# Patient Record
Sex: Female | Born: 1945 | Race: White | Hispanic: No | Marital: Married | Smoking: Never smoker
Health system: Southern US, Community
[De-identification: ages and names within clinical notes are randomized; demographics above are authoritative.]

---

## 2023-06-16 DIAGNOSIS — E872 Acidosis, unspecified: Secondary | ICD-10-CM

## 2023-06-16 DIAGNOSIS — R410 Disorientation, unspecified: Principal | ICD-10-CM | POA: Diagnosis present

## 2023-06-16 DIAGNOSIS — G9341 Metabolic encephalopathy: Secondary | ICD-10-CM | POA: Diagnosis present

## 2023-06-16 DIAGNOSIS — W19XXXA Unspecified fall, initial encounter: Secondary | ICD-10-CM

## 2023-06-16 DIAGNOSIS — G9340 Encephalopathy, unspecified: Principal | ICD-10-CM

## 2023-06-16 NOTE — ED Provider Notes (Signed)
EMERGENCY DEPARTMENT AT [facility] Provider Note   CSN: [id number] Arrival date & time: 06/16/23  2241     History {Add pertinent medical, surgical, social history, OB history to HPI:1} No chief complaint on file.   Lorraine James is a 77 y.o. female.  HPI    77 year old patient comes in with chief complaint of mechanical fall. Patient comes from her home.  I called patient's home phone, but there was no response.  Patient arrives as a level 1 trauma.  Patient brought here by EMS.  According to EMS, patient had unwitnessed fall and was found on the floor by family.  Patient was placed on nonrebreather by them and she required intermittent BVM.  Patient also on Xarelto.  Additionally patient was found to have single blood pressure that was in the 80s systolic.  Unknown last known normal.  Home Medications Prior to Admission medications   Not on File      Allergies    Patient has no allergy information on record.    Review of Systems   Review of Systems  All other systems reviewed and are negative.   Physical Exam Updated Vital Signs BP 108/60   Temp 98.8 F (37.1 C)  Physical Exam Vitals and nursing note reviewed.  Constitutional:      Appearance: She is well-developed.     Comments: Somnolent, responds to noxious stimuli  HENT:     Head: Atraumatic.  Neck:     Comments: In a c-collar Cardiovascular:     Rate and Rhythm: Normal rate.  Pulmonary:     Effort: Pulmonary effort is normal.  Abdominal:     Tenderness: There is no abdominal tenderness.  Musculoskeletal:     Comments: Head to toe evaluation shows no hematoma, bleeding of the scalp, no facial abrasions, no spine step offs, crepitus of the chest or neck, no tenderness to palpation of the bilateral upper and lower extremities, no gross deformities, no chest tenderness, no pelvic pain.   Skin:    General: Skin is warm and dry.  Neurological:     Mental Status: She is oriented to  person, place, and time.     ED Results / Procedures / Treatments   Labs (all labs ordered are listed, but only abnormal results are displayed) Labs Reviewed  CBC - Abnormal; Notable for the following components:      Result Value   RBC 2.32 (*)    Hemoglobin 7.3 (*)    HCT 23.5 (*)    MCV 101.3 (*)    RDW 17.2 (*)    All other components within normal limits  I-STAT CHEM 8, ED - Abnormal; Notable for the following components:   Creatinine, Ser 1.30 (*)    Glucose, Bld 134 (*)    Hemoglobin 7.8 (*)    HCT 23.0 (*)    All other components within normal limits  I-STAT VENOUS BLOOD GAS, ED - Abnormal; Notable for the following components:   HCT 21.0 (*)    Hemoglobin 7.1 (*)    All other components within normal limits  PROTIME-INR  COMPREHENSIVE METABOLIC PANEL  ETHANOL  URINALYSIS, ROUTINE W REFLEX MICROSCOPIC  I-STAT CHEM 8, ED  I-STAT CG4 LACTIC ACID, ED  SAMPLE TO BLOOD BANK    EKG None   Date: 06/16/2023  Rate: 82  Rhythm: normal sinus rhythm  QRS Axis: normal  Intervals: normal  ST/T Wave abnormalities: normal  Conduction Disutrbances: none  Narrative Interpretation: unremarkable  Radiology No results found.  Procedures .Critical Care  Performed by: Derwood Kaplan, MD Authorized by: Derwood Kaplan, MD   Critical care provider statement:    Critical care time (minutes):  58   Critical care was necessary to treat or prevent imminent or life-threatening deterioration of the following conditions:  CNS failure or compromise   Critical care was time spent personally by me on the following activities:  Development of treatment plan with patient or surrogate, discussions with consultants, evaluation of patient's response to treatment, examination of patient, ordering and review of laboratory studies, ordering and review of radiographic studies, ordering and performing treatments and interventions, pulse oximetry, re-evaluation of patient's condition and  review of old charts   {Document cardiac monitor, telemetry assessment procedure when appropriate:1}  Medications Ordered in ED Medications  iohexol (OMNIPAQUE) 350 MG/ML injection 75 mL (75 mLs Intravenous Contrast Given 06/16/23 2308)    ED Course/ Medical Decision Making/ A&P   {   Click here for ABCD2, HEART and other calculatorsREFRESH Note before signing :1}                              Medical Decision Making Amount and/or Complexity of Data Reviewed Labs: ordered. Radiology: ordered.  Risk Prescription drug management.   77 year old patient with pertinent past medical history of Plavix use for CAD comes in with chief complaint of acute altered mental status change.  Collateral history provided by EMS.  Unable to reach family when I call the home phone.  I have reviewed patient's previous records including the medications.  Patient is not on any pain meds.  She does take gabapentin.  Differential diagnosis considered for this patient includes: ICH / Stroke, acute coronary syndrome, Infection - UTI/Pneumonia/soft tissue infection leading to encephalopathy, encephalopathy due to electrolyte abnormality or drug interactions or toxins or metabolic conditions like adrenal insufficiency, hyperglycemia, paraneoplastic process, Hypercapnia / COPD Hypoxia  Based on initial assessment, higher suspicion for acute brain bleed.  Polypharmacy.  UTI.  Plan is to get basic labs and CT scan of the brain, CT blunt chest abdomen pelvis with contrast.  Reassessment: I have independently reviewed the following labs: Patient found to have anemia with hemoglobin less than 8.  She also has 7.28 is pH without hypercapnia.  Ethanol level normal.  I have independently reviewed the following imaging: CT scan of the brain, which is negative for acute brain bleed.  Patient was also reassessed at 12:20 AM.  Patient continues to have waxing and waning mentation.  She responds to noxious stimuli,  follows simple commands, but does not communicate and then she falls asleep again.  CT angiogram ordered to see if there is any basilar insufficiency. Given patient's anemia, she will need admission to the hospital.  She is Hemoccult negative.   Final Clinical Impression(s) / ED Diagnoses Final diagnoses:  None    Rx / DC Orders ED Discharge Orders     None

## 2023-06-16 NOTE — ED Provider Notes (Incomplete)
   EMERGENCY DEPARTMENT AT [facility] Provider Note   CSN: [id number] Arrival date & time: 06/16/23  2241     History {Add pertinent medical, surgical, social history, OB history to HPI:1} No chief complaint on file.   Lorraine James is a 77 y.o. female.  HPI    77 year old patient comes in with chief complaint of mechanical fall.   Home Medications Prior to Admission medications   Not on File      Allergies    Patient has no allergy information on record.    Review of Systems   Review of Systems  All other systems reviewed and are negative.   Physical Exam Updated Vital Signs BP 108/60   Temp 98.8 F (37.1 C)  Physical Exam Vitals and nursing note reviewed.  Constitutional:      Appearance: She is well-developed.  HENT:     Head: Atraumatic.  Cardiovascular:     Rate and Rhythm: Normal rate.  Pulmonary:     Effort: Pulmonary effort is normal.  Musculoskeletal:     Cervical back: Normal range of motion and neck supple.  Skin:    General: Skin is warm and dry.  Neurological:     Mental Status: She is alert and oriented to person, place, and time.     ED Results / Procedures / Treatments   Labs (all labs ordered are listed, but only abnormal results are displayed) Labs Reviewed  CBC - Abnormal; Notable for the following components:      Result Value   RBC 2.32 (*)    Hemoglobin 7.3 (*)    HCT 23.5 (*)    MCV 101.3 (*)    RDW 17.2 (*)    All other components within normal limits  I-STAT CHEM 8, ED - Abnormal; Notable for the following components:   Creatinine, Ser 1.30 (*)    Glucose, Bld 134 (*)    Hemoglobin 7.8 (*)    HCT 23.0 (*)    All other components within normal limits  I-STAT VENOUS BLOOD GAS, ED - Abnormal; Notable for the following components:   HCT 21.0 (*)    Hemoglobin 7.1 (*)    All other components within normal limits  PROTIME-INR  COMPREHENSIVE METABOLIC PANEL  ETHANOL  URINALYSIS, ROUTINE W REFLEX  MICROSCOPIC  I-STAT CHEM 8, ED  I-STAT CG4 LACTIC ACID, ED  SAMPLE TO BLOOD BANK    EKG None   Date: 06/16/2023  Rate: 82  Rhythm: normal sinus rhythm  QRS Axis: normal  Intervals: normal  ST/T Wave abnormalities: normal  Conduction Disutrbances: none  Narrative Interpretation: unremarkable   Radiology No results found.  Procedures Procedures  {Document cardiac monitor, telemetry assessment procedure when appropriate:1}  Medications Ordered in ED Medications  iohexol (OMNIPAQUE) 350 MG/ML injection 75 mL (75 mLs Intravenous Contrast Given 06/16/23 2308)    ED Course/ Medical Decision Making/ A&P   {   Click here for ABCD2, HEART and other calculatorsREFRESH Note before signing :1}                              Medical Decision Making Amount and/or Complexity of Data Reviewed Labs: ordered. Radiology: ordered.  Risk Prescription drug management.    Final Clinical Impression(s) / ED Diagnoses Final diagnoses:  None    Rx / DC Orders ED Discharge Orders     None

## 2023-06-16 NOTE — ED Triage Notes (Signed)
Pt BIB [facility] from home. Pt had unwitnessed  fall and found in the floor by family; on arrival pt had nonrebreather on.  Pt takes Xeralto.  Per EMS pt has had 6 other falls this month requiring transport.

## 2023-06-16 NOTE — ED Notes (Signed)
Trauma Response Nurse Documentation   Lorraine James is a 77 y.o. female arriving to [facility] ED via [facility]  On {meds; anticoagulants:31417}. Trauma was activated as a {Trauma Level:26868} by *** based on the following trauma criteria {Trauma criteria:26865}.  Patient cleared for CT by Dr. Marland Kitchen Pt transported to {TRN Radiology:26861::"CT"} with trauma response nurse present to monitor. RN remained with the patient throughout their absence from the department for clinical observation.   [facility] ***.  History   No past medical history on file.   *** The histories are not reviewed yet. Please review them in the "History" navigator section and refresh this SmartLink.     Initial Focused Assessment (If applicable, or please see trauma documentation):   CT's Completed:   {Trauma CT:26866}   Interventions:   Plan for disposition:  {Trauma Dispo:26867}   Consults completed:  {Trauma Consults:26862} at ***.  Event Summary:  MTP Summary (If applicable):   Bedside handoff with {Trauma handoff:26863::"ED RN"} ***.    Leota Sauers  Trauma Response RN  Please call TRN at [phone] for further assistance.

## 2023-06-16 NOTE — Progress Notes (Signed)
Orthopedic Tech Progress Note Patient Details:  Lorraine James 1946/03/08 [id number]  Patient ID: [name], female   DOB: Jan 25, 1946, 77 y.o.   MRN: [id number] I attended trauma page. Trinna Post 06/16/2023, 11:18 PM

## 2023-06-17 ENCOUNTER — Emergency Department (HOSPITAL_COMMUNITY): Payer: Medicare PPO

## 2023-06-17 ENCOUNTER — Inpatient Hospital Stay (HOSPITAL_COMMUNITY): Payer: Medicare PPO

## 2023-06-17 DIAGNOSIS — W1830XA Fall on same level, unspecified, initial encounter: Secondary | ICD-10-CM

## 2023-06-17 DIAGNOSIS — S42202A Unspecified fracture of upper end of left humerus, initial encounter for closed fracture: Secondary | ICD-10-CM

## 2023-06-17 DIAGNOSIS — R410 Disorientation, unspecified: Principal | ICD-10-CM | POA: Diagnosis present

## 2023-06-17 DIAGNOSIS — R569 Unspecified convulsions: Secondary | ICD-10-CM | POA: Diagnosis not present

## 2023-06-17 DIAGNOSIS — G9341 Metabolic encephalopathy: Secondary | ICD-10-CM | POA: Diagnosis present

## 2023-06-17 NOTE — ED Notes (Signed)
Patient transported to MRI 

## 2023-06-17 NOTE — ED Notes (Signed)
Lorraine James (son) [phone]. Would like an update

## 2023-06-17 NOTE — ED Notes (Signed)
PT's daughter informed this RN that brother is regulating PT's medications at home and has been unsuccessful, stating that PT's Rx are empty before their refill dates, and PT has stopped allowing her son to hold onto medicine for her. She is worried that overuse of prescriptions is the cause of increased confusion and falls recently, and is requesting assistance with developing a plan of care for her mother (the PT)

## 2023-06-17 NOTE — Progress Notes (Signed)
Pharmacy Antibiotic Note  Lorraine James is a 77 y.o. female admitted on 06/16/2023 with  ?UTI .  Pharmacy has been consulted for Zosyn dosing. WBC WNL. Scr 1.3.   Plan: Zosyn 3.375G IV q8h to be infused over 4 hours F/U cultures   Temp (24hrs), Avg:98.4 F (36.9 C), Min:97.9 F (36.6 C), Max:98.8 F (37.1 C)  Recent Labs  Lab 06/16/23 2048 06/16/23 2248 06/16/23 2249  WBC 8.0  --   --   CREATININE 1.44* 1.30*  --   LATICACIDVEN  --   --  0.6    CrCl cannot be calculated (Unknown ideal weight.).    Not on File  Abran Duke, PharmD, BCPS Clinical Pharmacist Phone: [phone]

## 2023-06-17 NOTE — Procedures (Signed)
Patient Name: Lorraine James  MRN: [id number]  Epilepsy Attending: Charlsie Quest  Referring Physician/Provider: Gilda Crease, MD  Date: 06/17/2023 Duration: 24.44 mins  Patient history: 77 yo F with ams getting eeg to evaluate for seizure  Level of alertness: Awake  AEDs during EEG study: None  Technical aspects: This EEG study was done with scalp electrodes positioned according to the 10-20 International system of electrode placement. Electrical activity was reviewed with band pass filter of 1-70Hz , sensitivity of 7 uV/mm, display speed of 26mm/sec with a 60Hz  notched filter applied as appropriate. EEG data were recorded continuously and digitally stored.  Video monitoring was available and reviewed as appropriate.  Description: EEG showed continuous generalized polymorphic 3 to 6 Hz theta-delta slowing. Physiologic photic driving was not seen during photic stimulation.  Hyperventilation was not performed.     ABNORMALITY - Continuous slow, generalized  IMPRESSION: This study is suggestive of moderate diffuse encephalopathy. No seizures or epileptiform discharges were seen throughout the recording.  Talicia Sui Annabelle Harman

## 2023-06-17 NOTE — ED Provider Notes (Addendum)
Patient signed out to me by Dr. Rhunette Croft.  Patient seen as a level 1 trauma.  Patient reportedly fell at home and is on blood thinners, was altered.  Trauma workup has been unremarkable other than proximal humerus fracture on the left.  Dr. Hillery Hunter, trauma, does not feel that there is any further trauma workup necessary.  Patient still somnolent.  We did not have a lot of records on the patient initially.  I have contacted her son, Laurine Blazer at [phone] and he has provided additional information.  Patient does have a history of waxing and waning levels of consciousness and has had admissions with significant confusion especially with dehydration and UTI.  Patient's current medication list is gabapentin, telmisartan, atorvastatin, oxycodone, omeprazole, fenofibrate, multivitamin and she also has baclofen as needed.  She does have multiple medications that could be causing sedation.  Discussed briefly with neurology.  Will obtain routine MRI, routine EEG.  In and out urinalysis pending.  Admit to medicine.  Addendum: UA positive, rocephin given, culture pending.   Discussed with Dr. Roda Shutters, orthopedics, will consult.   Gilda Crease, MD 06/17/23 7829    Gilda Crease, MD 06/17/23 5621    Gilda Crease, MD 06/17/23 0630

## 2023-06-17 NOTE — Final Consult Note (Signed)
Patient ID: Lorraine James 06/24/1946  Admit date: 06/16/2023 Admitting Diagnoses: Unwitnessed fall L humerus fracture Possible UTI  Tertiary Survey: Airway intact and oxygenating well. Hemodynamically stable. Confused but pleasant and able to move extremities. Injury to LUE.   Trauma scans reviewed: Yes Additional Imaging recommendations: No Labs reviewed: Yes Additional lab work recommendations: Yes: trend hgb and transfuse for hgb <7.0  Does the patient have any new complaints? No Cervical Collar Cleared: Yes DVT ppx ordered? No and recommend holding xarelto until hgb stabilized Note: dosing for DVT prophylaxis in the setting of trauma and normal renal function is 30mg  BID or 40mg  BID if >100kg or BMI>35 Is patient age > 15 (77 y.o.) : Yes  Exam: General: pleasant, WD, elderly female who is laying in bed in NAD HEENT: head is normocephalic, atraumatic.  Sclera are noninjected.  PERRL.  Ears and nose without any masses or lesions.  Mouth is pink and moist Heart: regular, rate, and rhythm. Palpable radial and pedal pulses bilaterally Lungs: CTAB, no wheezes, rhonchi, or rales noted.  Respiratory effort nonlabored Abd: soft, NT, ND MS: 2+ pitting edema to BLE, mild ttp of LUE, L hand WWP Neuro: following commands Psych: pleasantly confused.  Incidental Findings: Possible UTI - Cx pending, abx per primary   Wound Care: none   Follow-up Appointments: Orthopedic surgery   Trauma will sign off at this time, remainder of care per [facility] and orthopedic surgery.   Juliet Rude, [facility] 06/17/2023, 8:55 AM Please see Amion for pager number during day hours 7:00am-4:30pm

## 2023-06-17 NOTE — ED Notes (Signed)
Pt is awake and talking to self at this time. This RN asked the pt orientation questions; pt continues to repeat the same things in reference to "her working at [employer]". Pt is unable to tell this RN where she is. Pt does admit to taking Triazolam last night.

## 2023-06-17 NOTE — Evaluation (Signed)
Clinical/Bedside Swallow Evaluation Patient Details  Name: Lorraine James MRN: [id number] Date of Birth: August 10, 1945  Today's Date: 06/17/2023 Time: SLP Start Time (ACUTE ONLY): 1315 SLP Stop Time (ACUTE ONLY): 1335 SLP Time Calculation (min) (ACUTE ONLY): 20 min  Past Medical History: No past medical history on file.  HPI:  Patient is a 77 y.o. female with PMH: HTN, intermittent confusion, chronic pain. She presented to the hospital on 06/17/23 as a level 1 trauma after falling at home. (is on blood thinners) She lives with her son and per his report, she started getting confused day prior to admission and day of admission he heard a big thud and saw her down in the kitchen. She was discharged from [facility] a week ago for confusion and at that time, only explanation medically was dehydration. For current admission,, UA was postive for infection, she has had persistent encephalopathy and trauma workup revealed a left humeral fracture. MRI brain negative for acute intracranial abnormality but did show extensive chronic small vessel ischemia. She was made NPO and swallow evaluation ordered.    Assessment / Plan / Recommendation  Clinical Impression  Patient is not currently presenting with clinical s/s of dyspahgia as per this bedside swallow evaluation but she is significantly confused and this can put her at risk for PO intolerance. SLP provided setup assistance and patient then able to feed self water (via straw sips), puree (applesauce) and mechanical soft (graham crackers). She is edentulous but when she started eating graham crackers she exclaimed, "where are my dentures?" SLP was able to locat them in denture cup with her personal belongings and rinsed and cleaned them off. She was able to put dentures in and reported they fit fine but that "these aren't mine", asking SLP to show her her name on denture cup (patient label sticker). No overt s/s aspiration observed. SLP recomending  initiate PO diet of dysphagia 3 (mechanical soft) solids, thin liquids and will follow up at least once to ensure toleration and advance to regular textures. SLP Visit Diagnosis: Dysphagia, unspecified (R13.10)    Aspiration Risk  No limitations    Diet Recommendation Dysphagia 3 (Mech soft);Thin liquid    Liquid Administration via: Cup;Straw Medication Administration: Whole meds with liquid Supervision: Patient able to self feed;Intermittent supervision to cue for compensatory strategies Compensations: Slow rate;Small sips/bites Postural Changes: Seated upright at 90 degrees    Other  Recommendations Oral Care Recommendations: Oral care BID    Recommendations for follow up therapy are one component of a multi-disciplinary discharge planning process, led by the attending physician.  Recommendations may be updated based on patient status, additional functional criteria and insurance authorization.  Follow up Recommendations No SLP follow up      Assistance Recommended at Discharge    Functional Status Assessment Patient has had a recent decline in their functional status and demonstrates the ability to make significant improvements in function in a reasonable and predictable amount of time.  Frequency and Duration min 1 x/week  1 week       Prognosis Prognosis for improved oropharyngeal function: Good      Swallow Study   General Date of Onset: 06/17/23 HPI: Patient is a 77 y.o. female with PMH: HTN, intermittent confusion, chronic pain. She presented to the hospital on 06/17/23 as a level 1 trauma after falling at home. (is on blood thinners) She lives with her son and per his report, she started getting confused day prior to admission and day of admission he  heard a big thud and saw her down in the kitchen. She was discharged from [facility] a week ago for confusion and at that time, only explanation medically was dehydration. For current admission,, UA was postive for  infection, she has had persistent encephalopathy and trauma workup revealed a left humeral fracture. MRI brain negative for acute intracranial abnormality but did show extensive chronic small vessel ischemia. She was made NPO and swallow evaluation ordered. Type of Study: Bedside Swallow Evaluation Previous Swallow Assessment: none found Diet Prior to this Study: NPO Temperature Spikes Noted: No Respiratory Status: Room air History of Recent Intubation: No Behavior/Cognition: Alert;Cooperative;Pleasant mood;Confused Oral Cavity Assessment: Within Functional Limits Oral Care Completed by SLP: No Oral Cavity - Dentition: Dentures, top;Dentures, bottom Vision: Functional for self-feeding Self-Feeding Abilities: Able to feed self Patient Positioning: Upright in bed Baseline Vocal Quality: Normal Volitional Cough: Cognitively unable to elicit Volitional Swallow: Unable to elicit    Oral/Motor/Sensory Function Overall Oral Motor/Sensory Function: Within functional limits   Ice Chips     Thin Liquid Thin Liquid: Within functional limits Presentation: Straw;Self Fed    Nectar Thick     Honey Thick     Puree Puree: Within functional limits Presentation: Spoon;Self Fed   Solid     Solid: Within functional limits Presentation: Self Fed      Angela Nevin, MA, CCC-SLP Speech Therapy

## 2023-06-17 NOTE — ED Notes (Signed)
Pts dentures removed and placed in denture cup with pt label.

## 2023-06-17 NOTE — Progress Notes (Signed)
Contacted nurse in regards to comfort care to see if EEG was still wanted. Currently waiting for provider to decide on if EEG is needed.

## 2023-06-17 NOTE — Progress Notes (Signed)
   06/16/23 2235  Spiritual Encounters  Type of Visit Initial  Care provided to: Pt not available  Referral source Trauma page  Reason for visit Trauma  OnCall Visit No   Chaplain responded to a level one trauma. The patient, Lorraine James, was attended to by the medical team. No family is present. If a chaplain is requested someone will respond.   Valerie Roys [facility]  [phone]

## 2023-06-17 NOTE — Progress Notes (Signed)
EEG complete - results pending 

## 2023-06-17 NOTE — Evaluation (Signed)
Physical Therapy Evaluation Patient Details Name: Lorraine James MRN: [id number] DOB: 04/18/1946 Today's Date: 06/17/2023  History of Present Illness  This is a 77 year old woman who presented as a level 1 trauma after falling at home on blood thinners.  Full trauma workup revealed a left humeral fracture.  She has had persistent encephalopathy since admission.  PMH of HTN, chronic pain, VTE.  Clinical Impression  Pt presents with admitting diagnosis above. Pt very confused stating that we are at [employer] and she is looking for "cheese bracelets". Pt constantly pulling at lines and leads requiring constant cues for safety. Pt today required +2 Max/Total A for mobility. PT able to stand EOB however noted with severe knee buckling when taking steps. Pt was a poor historian however reports that she occasionally used a walker at home however no family was present to confirm this. Patient will benefit from continued inpatient follow up therapy, <3 hours/day. PT will continue to follow.         If plan is discharge home, recommend the following: Two people to help with walking and/or transfers;A lot of help with bathing/dressing/bathroom;Assistance with cooking/housework;Direct supervision/assist for medications management;Assist for transportation;Help with stairs or ramp for entrance;Direct supervision/assist for financial management;Supervision due to cognitive status   Can travel by private vehicle   No    Equipment Recommendations Other (comment) (Per accepting facility)  Recommendations for Other Services       Functional Status Assessment Patient has had a recent decline in their functional status and demonstrates the ability to make significant improvements in function in a reasonable and predictable amount of time.     Precautions / Restrictions Precautions Precautions: Fall Required Braces or Orthoses: Sling Restrictions Weight Bearing Restrictions: Yes LUE Weight Bearing: Non  weight bearing      Mobility  Bed Mobility Overal bed mobility: Needs Assistance Bed Mobility: Supine to Sit, Sit to Supine     Supine to sit: +2 for physical assistance, Max assist Sit to supine: +2 for safety/equipment, Total assist   General bed mobility comments: Via helicopter method    Transfers Overall transfer level: Needs assistance Equipment used: 2 person hand held assist Transfers: Sit to/from Stand Sit to Stand: +2 physical assistance, Mod assist, From elevated surface           General transfer comment: +2 Mod A from elevated stretcher. Pt noted with knee buckling with side steps and standing marches.    Ambulation/Gait               General Gait Details: deferred for safety  Stairs            Wheelchair Mobility     Tilt Bed    Modified Rankin (Stroke Patients Only)       Balance Overall balance assessment: Needs assistance Sitting-balance support: Single extremity supported, Feet supported Sitting balance-Leahy Scale: Poor Sitting balance - Comments: Attempting to lay back down Postural control: Left lateral lean Standing balance support: Bilateral upper extremity supported Standing balance-Leahy Scale: Poor Standing balance comment: Reliant on external support                             Pertinent Vitals/Pain Pain Assessment Pain Assessment: Faces Faces Pain Scale: Hurts even more Pain Location: L arm Pain Descriptors / Indicators: Discomfort, Grimacing, Moaning Pain Intervention(s): Monitored during session, Repositioned    Home Living Family/patient expects to be discharged to:: Private residence Living Arrangements: Children (Son)  Available Help at Discharge: Family;Available PRN/intermittently Type of Home: House Home Access: Level entry           Additional Comments: Pt not a great historian however reports that she lives with her son who helps out. Says she uses a walker occasionally.    Prior  Function Prior Level of Function : Patient poor historian/Family not available             Mobility Comments: Pt reports using a RW sometimes.       Extremity/Trunk Assessment   Upper Extremity Assessment Upper Extremity Assessment: LUE deficits/detail LUE Deficits / Details: L humerus fx    Lower Extremity Assessment Lower Extremity Assessment: Generalized weakness    Cervical / Trunk Assessment Cervical / Trunk Assessment: Kyphotic  Communication   Communication Communication: Difficulty following commands/understanding Following commands: Follows one step commands inconsistently;Follows multi-step commands inconsistently Cueing Techniques: Verbal cues;Tactile cues  Cognition Arousal: Alert Behavior During Therapy: Impulsive, Restless Overall Cognitive Status: Impaired/Different from baseline Area of Impairment: Orientation, Attention, Memory, Safety/judgement, Following commands, Awareness, Problem solving                 Orientation Level: Disoriented to, Place, Time, Situation Current Attention Level: Alternating Memory: Decreased short-term memory, Decreased recall of precautions Following Commands: Follows one step commands inconsistently, Follows multi-step commands inconsistently Safety/Judgement: Decreased awareness of safety, Decreased awareness of deficits Awareness: Emergent Problem Solving: Slow processing, Decreased initiation, Difficulty sequencing, Requires verbal cues, Requires tactile cues General Comments: Pt very confused stating that we are at [employer] and she is looking for "cheese bracelets". Pt constantly pulling at lines and leads required constant cues for safety.        General Comments General comments (skin integrity, edema, etc.): VSS    Exercises     Assessment/Plan    PT Assessment Patient needs continued PT services  PT Problem List Decreased strength;Decreased range of motion;Decreased activity tolerance;Decreased  balance;Decreased mobility;Decreased coordination;Decreased cognition;Decreased knowledge of use of DME;Decreased safety awareness;Decreased knowledge of precautions;Pain       PT Treatment Interventions DME instruction;Gait training;Stair training;Functional mobility training;Therapeutic activities;Therapeutic exercise;Balance training;Neuromuscular re-education;Cognitive remediation;Patient/family education    PT Goals (Current goals can be found in the Care Plan section)  Acute Rehab PT Goals PT Goal Formulation: Patient unable to participate in goal setting Time For Goal Achievement: 07/01/23 Potential to Achieve Goals: Poor    Frequency Min 1X/week     Co-evaluation               AM-PAC PT "6 Clicks" Mobility  Outcome Measure Help needed turning from your back to your side while in a flat bed without using bedrails?: A Lot Help needed moving from lying on your back to sitting on the side of a flat bed without using bedrails?: A Lot Help needed moving to and from a bed to a chair (including a wheelchair)?: Total Help needed standing up from a chair using your arms (e.g., wheelchair or bedside chair)?: Total Help needed to walk in hospital room?: Total Help needed climbing 3-5 steps with a railing? : Total 6 Click Score: 8    End of Session Equipment Utilized During Treatment: Gait belt Activity Tolerance: Patient limited by pain;Patient limited by fatigue Patient left: in bed;with call bell/phone within reach;with nursing/sitter in room Nurse Communication: Mobility status PT Visit Diagnosis: Other abnormalities of gait and mobility (R26.89)    Time: 1610-9604 PT Time Calculation (min) (ACUTE ONLY): 17 min   Charges:   PT Evaluation $  PT Eval Moderate Complexity: 1 Mod   PT General Charges $$ ACUTE PT VISIT: 1 Visit         Shela Nevin, PT, DPT Acute Rehab Services [phone]   Gladys Damme 06/17/2023, 10:17 AM

## 2023-06-17 NOTE — Progress Notes (Signed)
Son updated on pt status

## 2023-06-17 NOTE — H&P (Addendum)
NAME:  Lorraine James, MRN:  [id number], DOB:  May 16, 1946, LOS: 0 ADMISSION DATE:  06/16/2023, CONSULTATION DATE:  06/17/23 REFERRING MD:  Blinda Leatherwood, CHIEF COMPLAINT:  ams   History of Present Illness:  This is a 77 year old woman who presented as a level 1 trauma after falling at home on blood thinners.  Full trauma workup revealed a left humeral fracture.  She has had persistent encephalopathy since admission.  UA positive for infection.  Due to a question of ability to protect airway, PCCM consulted for admission.  On arrival, the patient will wake up to painful stimuli and move all 4 of her extremities.  She keeps asking "[facility] am [facility] [employer], imaging, clinical course to date reviewed.  Per son, yesterday started getting confused.  Son heard a big thud and saw her down in the kitchen.  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  Could not find reason for this other than dehydration  Patient's current medication list is gabapentin, telmisartan, atorvastatin, oxycodone, omeprazole, fenofibrate, multivitamin and she also has baclofen as needed   Significant Hospital Events: Including procedures, antibiotic start and stop dates in addition to other pertinent events   11/19 admit  Interim History / Subjective:  Admit  Objective   Blood pressure (!) 142/71, pulse 97, temperature 97.9 F (36.6 C), temperature source Axillary, resp. rate (!) 24, SpO2 95%.       No intake or output data in the 24 hours ending 06/17/23 0538 There were no vitals filed for this visit.  Examination: General: No acute distress HENT: Dilated but reactive pupils, tracking Lungs: Clear, no accessory muscle use Cardiovascular: Heart sounds are regular, extremities are warm Abdomen: Soft, positive bowel sounds Extremities: No edema, mild arthritic changes Neuro: Unable to move  left upper arm.  No obvious asterixis.  Alert and oriented potentially to self.  RASS -1 Skin: no rashes  MRI negative EEG pending Pan scan with a very small subpleural nodule in the right lower lobe, left humeral neck fracture  Resolved Hospital Problem list   Not applicable  Assessment & Plan:  Acute metabolic encephalopathy in the setting of UTI and multiple CNS altering home medications Status post fall with left humeral fracture, no evidence of intracranial injury Chronic pain, hypertension Mild hypercapnia-doubt she would tolerate BiPAP but it has been ordered  -Follow-up EEG, ammonia -Avoid sedating meds -Will do Zosyn as she had a recent hospitalization, follow-up culture data from UTI (avoiding cefepime in the context of encephalopathy) -PT/OT/SLP consults -Will need a sling for her arm and outpatient orthopedic follow-up -She does not really have any ICU needs, I will sign [facility] for [facility] 06/18/2023 -Spoke with son on phone, patient is DNR  Best Practice (right click and "Reselect all SmartList Selections" daily)   Diet/type: NPO DVT prophylaxis: SCD Pressure ulcer(s).  GI prophylaxis: N/A Lines: N/A Foley:  N/A Code Status:  DNR Last date of multidisciplinary goals of care discussion [updated son 06/17/23]  Labs   CBC: Recent Labs  Lab 06/16/23 2048 06/16/23 2248 06/16/23 2316  WBC 8.0  --   --   HGB 7.3* 7.8* 7.1*  HCT 23.5* 23.0* 21.0*  MCV 101.3*  --   --   PLT 189  --   --     Basic Metabolic Panel: Recent Labs  Lab 06/16/23 2048 06/16/23  2248 06/16/23 2316  NA 141 144 143  K 3.8 3.8 3.7  CL 106 103  --   CO2 27  --   --   GLUCOSE 136* 134*  --   BUN 21 23  --   CREATININE 1.44* 1.30*  --   CALCIUM 8.4*  --   --    GFR: CrCl cannot be calculated (Unknown ideal weight.). Recent Labs  Lab 06/16/23 2048 06/16/23 2249  WBC 8.0  --   LATICACIDVEN  --  0.6    Liver Function Tests: Recent Labs  Lab 06/16/23 2048  AST 39   ALT 19  ALKPHOS 33*  BILITOT 0.9  PROT 5.6*  ALBUMIN 2.9*   No results for input(s): "LIPASE", "AMYLASE" in the last 168 hours. Recent Labs  Lab 06/17/23 0346  AMMONIA 13    ABG    Component Value Date/Time   HCO3 28.0 06/16/2023 2316   TCO2 30 06/16/2023 2316   O2SAT 68 06/16/2023 2316     Coagulation Profile: Recent Labs  Lab 06/16/23 2048  INR 1.1    Cardiac Enzymes: Recent Labs  Lab 06/17/23 0145  CKTOTAL 208    HbA1C: No results found for: "HGBA1C"  CBG: No results for input(s): "GLUCAP" in the last 168 hours.  Review of Systems:   Too encephalopathic to answer  Past Medical History:  HTN, chronic pain  Surgical History:  Unk  Social History:      Family History:  Her family history is not on file.   Allergies Not on File   Home Medications  Prior to Admission medications   Not on File   Patient's current medication list is gabapentin, telmisartan, atorvastatin, oxycodone, omeprazole, fenofibrate, multivitamin and she also has baclofen as needed   Critical care time: N/A

## 2023-06-18 DIAGNOSIS — R410 Disorientation, unspecified: Secondary | ICD-10-CM | POA: Diagnosis not present

## 2023-06-18 DIAGNOSIS — S42212A Unspecified displaced fracture of surgical neck of left humerus, initial encounter for closed fracture: Secondary | ICD-10-CM

## 2023-06-18 MED ORDER — OXYCODONE HCL 5 MG PO TABS
5.0000 mg | ORAL_TABLET | ORAL | Status: AC
Start: 2023-06-18 — End: 2023-06-18

## 2023-06-18 NOTE — Progress Notes (Signed)
   06/18/23 2036  BiPAP/CPAP/SIPAP  BiPAP/CPAP/SIPAP Pt Type Adult  Reason BIPAP/CPAP not in use Non-compliant

## 2023-06-18 NOTE — Progress Notes (Signed)
Order for Lidocaine patch, but she is asleep so will wait for her to wake up to place patch on left arm.

## 2023-06-18 NOTE — Progress Notes (Addendum)
Pain continues and is now 8/10 left arm. Lidocaine patch that was ordered is not helping. Notified Dr C. Hall . No other orders yet for anything for pain control.  0500- New order for pain control . See EMAR.

## 2023-06-18 NOTE — TOC Initial Note (Signed)
Transition of Care [facility]) - Initial/Assessment Note    Patient Details  Name: Lorraine James MRN: [id number] Date of Birth: 08-Jun-1946  [facility]) CM/SW Contact:    [place] Marchiano Felipa Emory, Student-Social Work Phone Number: 06/18/2023, 1:48 PM  Clinical Narrative:   MSW Student met with patient family at bedside and discussed possible SNF placements. Family were agreeable to recommendation. MSW Student discussed CMS choice with family and received permission to send out referral, will follow up with bed offers.   Expected Discharge Plan: Skilled Nursing Facility Barriers to Discharge: Insurance Authorization, Continued Medical Work up   Patient Goals and CMS Choice Patient states their goals for this hospitalization and ongoing recovery are:: patient unable to participate in goal setting, not fully oriented CMS Medicare.gov Compare Post Acute Care list provided to:: Patient Represenative (must comment) Choice offered to / list presented to : Adult Children [place] ownership interest in [facility].provided to:: Adult Children    Expected Discharge Plan and Services     Post Acute Care Choice: Skilled Nursing Facility Living arrangements for the past 2 months: Single Family Home                                      Prior Living Arrangements/Services Living arrangements for the past 2 months: Single Family Home Lives with:: Adult Children Patient language and need for interpreter reviewed:: No Do you feel safe going back to the place where you live?: Yes      Need for Family Participation in Patient Care: Yes (Comment) Care giver support system in place?: Yes (comment)   Criminal Activity/Legal Involvement Pertinent to Current Situation/Hospitalization: No - Comment as needed  Activities of Daily Living   ADL Screening (condition at time of admission) Independently performs ADLs?: No Does the patient have a NEW difficulty with  bathing/dressing/toileting/self-feeding that is expected to last >3 days?: Yes (Initiates electronic notice to provider for possible OT consult) Does the patient have a NEW difficulty with getting in/out of bed, walking, or climbing stairs that is expected to last >3 days?: Yes (Initiates electronic notice to provider for possible PT consult) Does the patient have a NEW difficulty with communication that is expected to last >3 days?: No Is the patient deaf or have difficulty hearing?: No Does the patient have difficulty seeing, even when wearing glasses/contacts?: No Does the patient have difficulty concentrating, remembering, or making decisions?: No  Permission Sought/Granted Permission sought to share information with : Facility Medical sales representative, Family Supports Permission granted to share information with : Yes, Verbal Permission Granted  Share Information with NAME: Judie Grieve  Permission granted to share info w AGENCY: SNF  Permission granted to share info w Relationship: Son     Emotional Assessment Appearance:: Appears stated age Attitude/Demeanor/Rapport: Engaged Affect (typically observed): Appropriate Orientation: : Oriented to Self, Oriented to Place Alcohol / Substance Use: Not Applicable Psych Involvement: No (comment)  Admission diagnosis:  Confusion [R41.0] Acute metabolic encephalopathy [G93.41] Patient Active Problem List   Diagnosis Date Noted   Confusion 06/17/2023   Acute metabolic encephalopathy 06/17/2023   PCP:  Paulina Fusi, MD Pharmacy:   [facility] [phone] - [place], [place] - [address] [facility] [place] [address] Phone: [phone] Fax: [phone]     Social Determinants of Health (SDOH) Social History: SDOH Screenings   Food Insecurity: No Food Insecurity (06/17/2023)  Housing: Low Risk  (  06/17/2023)  Transportation Needs: No Transportation Needs (06/17/2023)  Utilities: Not At Risk (06/17/2023)   SDOH  Interventions:     Readmission Risk Interventions     No data to display

## 2023-06-18 NOTE — Progress Notes (Addendum)
Notified Dr Margo Aye about pain level 6-7/10 left arm . Repositioned and sling is intact to left arm. Awaiting new order b/c she does not have anything ordered for pain relief.

## 2023-06-18 NOTE — Progress Notes (Signed)
The patient  is running low grade temp of 100.7 . Notified Dr. [name] for Tylenol po if she would like it treated. No new order received. Will continue to monitor.

## 2023-06-18 NOTE — Progress Notes (Signed)
Transition of Care [facility]) - CAGE-AID Screening   Patient Details  Name: Lorraine James MRN: [id number] Date of Birth: [birth year]  Transition of Care [facility]) CM/SW Contact:    Leota Sauers, RN Phone Number: 06/18/2023, 8:00 PM   Clinical Narrative:  Patient denies use of alcohol and illicit drugs. Resources not given at this time.   CAGE-AID Screening:    Have You Ever Felt You Ought to Cut Down on Your Drinking or Drug Use?: No Have People Annoyed You By Critizing Your Drinking Or Drug Use?: No Have You Felt Bad Or Guilty About Your Drinking Or Drug Use?: No Have You Ever Had a Drink or Used Drugs First Thing In The Morning to Steady Your Nerves or to Get Rid of a Hangover?: No CAGE-AID Score: 0  Substance Abuse Education Offered: No

## 2023-06-18 NOTE — Plan of Care (Signed)
  Problem: Coping: Goal: Ability to adjust to condition or change in health will improve Outcome: Progressing   Problem: Activity: Goal: Risk for activity intolerance will decrease Outcome: Not Progressing   Problem: Pain Management: Goal: General experience of comfort will improve Outcome: Not Progressing

## 2023-06-18 NOTE — NC FL2 (Signed)
   MEDICAID FL2 LEVEL OF CARE FORM     IDENTIFICATION  Patient Name: Lorraine James Birthdate: 1946/02/25 Sex: female Admission Date (Current Location): 06/16/2023  [facility] and [place] Number:  [employer] and Address:  [facility], [address], [place], [place] [address]      Provider Number: [id number]  Attending Physician Name and Address:  Meredeth Ide, MD  Relative Name and Phone Number:       Current Level of Care: Hospital Recommended Level of Care: Skilled Nursing Facility Prior Approval Number:    Date Approved/Denied:   PASRR Number: [id number]  Discharge Plan: SNF    Current Diagnoses: Patient Active Problem List   Diagnosis Date Noted   Confusion 06/17/2023   Acute metabolic encephalopathy 06/17/2023    Orientation RESPIRATION BLADDER Height & Weight     Self, Place  Normal Incontinent Weight: 181 lb 7 oz (82.3 kg) Height:  5\' 2"  (157.5 cm)  BEHAVIORAL SYMPTOMS/MOOD NEUROLOGICAL BOWEL NUTRITION STATUS      Incontinent Diet (DYS 3)  AMBULATORY STATUS COMMUNICATION OF NEEDS Skin   Extensive Assist Verbally Normal                       Personal Care Assistance Level of Assistance  Bathing, Feeding, Dressing Bathing Assistance: Limited assistance Feeding assistance: Limited assistance Dressing Assistance: Limited assistance     Functional Limitations Info             SPECIAL CARE FACTORS FREQUENCY  PT (By licensed PT), OT (By licensed OT)     PT Frequency: 5x/week OT Frequency: 5x/week            Contractures      Additional Factors Info  Code Status, Allergies Code Status Info: DNR Allergies Info: lvl 1 fall, Confusion, Acute metabolic encephalopathy           Current Medications (06/18/2023):  This is the current hospital active medication list Current Facility-Administered Medications  Medication Dose Route Frequency Provider Last Rate Last Admin   baclofen (LIORESAL)  tablet 5 mg  5 mg Oral TID PRN Meredeth Ide, MD   5 mg at 06/18/23 1110   docusate sodium (COLACE) capsule 100 mg  100 mg Oral BID PRN Lorin Glass, MD       insulin aspart (novoLOG) injection 0-9 Units  0-9 Units Subcutaneous Q4H Lorin Glass, MD   2 Units at 06/18/23 1222   oxyCODONE (Oxy IR/ROXICODONE) immediate release tablet 5 mg  5 mg Oral Q6H PRN Meredeth Ide, MD   5 mg at 06/18/23 1601   piperacillin-tazobactam (ZOSYN) IVPB 3.375 g  3.375 g Intravenous Q8H Stevphen Rochester, RPH 12.5 mL/hr at 06/18/23 1413 3.375 g at 06/18/23 1413   polyethylene glycol (MIRALAX / GLYCOLAX) packet 17 g  17 g Oral Daily PRN Lorin Glass, MD         Discharge Medications: Please see discharge summary for a list of discharge medications.  Relevant Imaging Results:  Relevant Lab Results:   Additional Information SSN: [id number]  Baldemar Lenis, LCSW

## 2023-06-18 NOTE — Evaluation (Signed)
Occupational Therapy Evaluation Patient Details Name: Lorraine James MRN: [id number] DOB: 11/09/1945 Today's Date: 06/18/2023   History of Present Illness Pt is a 77 y/o F presenting to ED on 11/18 after being found down after unwitnessed fall. Imaging reveals L humerus fx with plan to manage nonoperatively in sling. Recently d/c'd from [facility] 1 week ago for confusion, current admission UA positive for infection, she has had persistent encephalopathyl PMH includes HTN, chronic pain, VTE.   Clinical Impression   Pt reports ind at baseline with ADLs, was using RW recently right before admission. Pt lives with son who can provide frequent assist at home. Pt currently needing set up -max A for ADLs, mod A for bed mobility and min A for step pivot transfers with 1 person HHA. Pt educated on NWB precautions, sling wear/positioning, and compensatory strategies for bed mobility, will benefit from continued education on ADL compensatory strategies. Pt presenting with impairments listed below, will follow acutely. Patient will benefit from continued inpatient follow up therapy, <3 hours/day to maximize safety/ind with ADL/functional mobility.        If plan is discharge home, recommend the following: A little help with walking and/or transfers;A lot of help with bathing/dressing/bathroom;Assistance with cooking/housework;Direct supervision/assist for medications management;Help with stairs or ramp for entrance;Assist for transportation;Direct supervision/assist for financial management    Functional Status Assessment  Patient has had a recent decline in their functional status and demonstrates the ability to make significant improvements in function in a reasonable and predictable amount of time.  Equipment Recommendations  Other (comment) (defer)    Recommendations for Other Services PT consult     Precautions / Restrictions Precautions Precautions: Fall Precaution Comments: LUE sling  at all times Required Braces or Orthoses: Sling Restrictions Weight Bearing Restrictions: Yes LUE Weight Bearing: Non weight bearing      Mobility Bed Mobility Overal bed mobility: Needs Assistance Bed Mobility: Sidelying to Sit   Sidelying to sit: Mod assist       General bed mobility comments: mod A for trunk elevation and scooting hips to EOB    Transfers Overall transfer level: Needs assistance Equipment used: 1 person hand held assist Transfers: Sit to/from Stand, Bed to chair/wheelchair/BSC Sit to Stand: Min assist     Step pivot transfers: Min assist     General transfer comment: min A for pivot transfer to chair      Balance Overall balance assessment: Needs assistance Sitting-balance support: Single extremity supported, Feet supported Sitting balance-Leahy Scale: Good     Standing balance support: Bilateral upper extremity supported Standing balance-Leahy Scale: Poor Standing balance comment: HHA to RUE, LUE supported by sling                           ADL either performed or assessed with clinical judgement   ADL Overall ADL's : Needs assistance/impaired Eating/Feeding: Set up;Sitting   Grooming: Minimal assistance;Sitting   Upper Body Bathing: Moderate assistance   Lower Body Bathing: Maximal assistance   Upper Body Dressing : Moderate assistance   Lower Body Dressing: Maximal assistance   Toilet Transfer: Minimal assistance;Regular Toilet   Toileting- Clothing Manipulation and Hygiene: Moderate assistance       Functional mobility during ADLs: Minimal assistance       Vision   Vision Assessment?: No apparent visual deficits     Perception Perception: Not tested       Praxis Praxis: Not tested  Pertinent Vitals/Pain Pain Assessment Pain Assessment: Faces Pain Score: 6  Faces Pain Scale: Hurts even more Pain Location: L arm Pain Descriptors / Indicators: Discomfort, Grimacing, Moaning Pain Intervention(s):  Limited activity within patient's tolerance, Monitored during session, Repositioned     Extremity/Trunk Assessment Upper Extremity Assessment Upper Extremity Assessment: Left hand dominant;Right hand dominant;LUE deficits/detail (ambidextrous) LUE Deficits / Details: L humerus fx, mild edema can make a fist and release, pain with wrist movement, elbow and shoulder movement not tested due to sling/immobilization LUE: Unable to fully assess due to immobilization LUE Coordination: decreased fine motor;decreased gross motor   Lower Extremity Assessment Lower Extremity Assessment: Defer to PT evaluation   Cervical / Trunk Assessment Cervical / Trunk Assessment: Kyphotic   Communication Communication Communication: No apparent difficulties   Cognition Arousal: Alert Behavior During Therapy: Impulsive, Restless Overall Cognitive Status: Impaired/Different from baseline Area of Impairment: Orientation, Attention, Memory, Safety/judgement, Following commands, Awareness, Problem solving                   Current Attention Level: Focused   Following Commands: Follows one step commands with increased time   Awareness: Emergent Problem Solving: Slow processing, Decreased initiation, Difficulty sequencing, Requires verbal cues, Requires tactile cues General Comments: slow processing noted at times, A & O x4     General Comments  VSS on RA    Exercises Exercises: Other exercises Other Exercises Other Exercises: educated pt on performing hand and wrist movement as tolerated for edema control Other Exercises: educated pt on sling positioning (handout also provided)   Shoulder Instructions      Home Living Family/patient expects to be discharged to:: Private residence Living Arrangements: Children (son) Available Help at Discharge: Family;Available 24 hours/day Type of Home: House Home Access: Stairs to enter [employer] of Steps: 2 Entrance Stairs-Rails: Can reach  both Home Layout: Two level;Able to live on main level with bedroom/bathroom Alternate Level Stairs-Number of Steps: flight   Bathroom Shower/Tub: Tub/shower unit;Walk-in shower         Home Equipment: Shower seat;Cane - single Librarian, academic (2 wheels)          Prior Functioning/Environment Prior Level of Function : Needs assist;Driving             Mobility Comments: RW at all times right before hospital admission ADLs Comments: ind, some IADLs, son assists with meds,        OT Problem List: Decreased strength;Decreased range of motion;Decreased activity tolerance;Impaired balance (sitting and/or standing);Impaired UE functional use;Impaired sensation;Decreased knowledge of precautions;Decreased knowledge of use of DME or AE      OT Treatment/Interventions: Self-care/ADL training;Therapeutic exercise;Energy conservation;DME and/or AE instruction;Therapeutic activities;Patient/family education;Balance training;Cognitive remediation/compensation;Visual/perceptual remediation/compensation    OT Goals(Current goals can be found in the care plan section) Acute Rehab OT Goals Patient Stated Goal: none stated OT Goal Formulation: With patient Time For Goal Achievement: 07/02/23 Potential to Achieve Goals: Good ADL Goals Pt Will Perform Upper Body Dressing: with supervision;sitting Pt Will Perform Lower Body Dressing: with supervision;sit to/from stand;sitting/lateral leans Pt Will Transfer to Toilet: ambulating;with supervision;regular height toilet Additional ADL Goal #1: pt will perform bed mobility CGA in prep for ADLs  OT Frequency: Min 1X/week    Co-evaluation              AM-PAC OT "6 Clicks" Daily Activity     Outcome Measure Help from another person eating meals?: A Little Help from another person taking care of personal grooming?: A Little Help from another  person toileting, which includes using toliet, bedpan, or urinal?: A Lot Help from another  person bathing (including washing, rinsing, drying)?: A Lot Help from another person to put on and taking off regular upper body clothing?: A Lot Help from another person to put on and taking off regular lower body clothing?: A Lot 6 Click Score: 14   End of Session Equipment Utilized During Treatment: Other (comment) (LUE sling) Nurse Communication: Mobility status  Activity Tolerance: Patient tolerated treatment well Patient left: with call bell/phone within reach;in chair;with chair alarm set  OT Visit Diagnosis: Unsteadiness on feet (R26.81);Other abnormalities of gait and mobility (R26.89);Muscle weakness (generalized) (M62.81);History of falling (Z91.81)                Time: 9629-5284 OT Time Calculation (min): 33 min Charges:  OT General Charges $OT Visit: 1 Visit OT Evaluation $OT Eval Moderate Complexity: 1 Mod OT Treatments $Self Care/Home Management : 8-22 mins  Carver Fila, OTD, OTR/L [employer] Acute Rehab [phone]   Carver Fila Koonce 06/18/2023, 3:46 PM

## 2023-06-18 NOTE — Progress Notes (Signed)
[facility]  PROGRESS NOTE  Lorraine James ZOX:[id number] DOB: Nov 26, 1945 DOA: 06/16/2023 PCP: Paulina Fusi, MD   Brief HPI:   77 year old woman who presented as a level 1 trauma after falling at home on blood thinners. Full trauma workup revealed a left humeral fracture.  Patient has had persistent encephalopathy since admission.  UA was positive for infection.  Due to concern for airway protection, PCCM was consulted. Did not require intubation She was transferred to medical floor, [facility] assumed care on 11/20 She was discharged a week ago from [place] due to confusion, they could not find a reason except for dehydration. Patient is on gabapentin, oxycodone, baclofen at home    Assessment/Plan:   Acute metabolic encephalopathy -Insetting of UTI, multiple psychotropic pain medications -Resolved at this time -Avoid sedating medications -Ammonia level 13 -EEG showed no epileptiform discharges; showed continuous slow generalized recording, suggestive of moderate diffuse encephalopathy  Chronic pain syndrome -Restart baclofen 5 mg p.o. 3 times daily as needed -Oxycodone at a reduced dose of 5 mg every 6 hours as needed -Hold gabapentin for now  UTI -Continue Zosyn -Follow urine culture results  Status post fall, left humerus fracture -Orthopedics consulted -Nonweightbearing in sling -Follow-up Dr. Roda Shutters in 2 to 3 weeks  Diabetes mellitus type 2 -CBG well-controlled -Continue sliding scale insulin with NovoLog  Hypertension -Blood pressure is now elevated, will restart metoprolol, patient's home medication at 50 mg p.o. 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, will resume Xarelto tonight  Medications     insulin aspart  0-9 Units Subcutaneous Q4H     Data Reviewed:   CBG:  Recent Labs  Lab 06/18/23 0350 06/18/23 0809 06/18/23 0855 06/18/23 1158 06/18/23 1625  GLUCAP 90 125* 147* 195* 128*    SpO2: 97 %    Vitals:   06/18/23  0346 06/18/23 0701 06/18/23 0759 06/18/23 1147  BP: (!) 151/63  (!) 162/76 (!) 160/73  Pulse: 99  (!) 109   Resp: 20  18 16   Temp: 99.5 F (37.5 C)  99.6 F (37.6 C) 99.7 F (37.6 C)  TempSrc: Oral  Oral Oral  SpO2: 94%  97% 97%  Weight:  82.3 kg    Height:          Data Reviewed:  Basic Metabolic Panel: Recent Labs  Lab 06/16/23 2048 06/16/23 2248 06/16/23 2316  NA 141 144 143  K 3.8 3.8 3.7  CL 106 103  --   CO2 27  --   --   GLUCOSE 136* 134*  --   BUN 21 23  --   CREATININE 1.44* 1.30*  --   CALCIUM 8.4*  --   --     CBC: Recent Labs  Lab 06/16/23 2048 06/16/23 2248 06/16/23 2316  WBC 8.0  --   --   HGB 7.3* 7.8* 7.1*  HCT 23.5* 23.0* 21.0*  MCV 101.3*  --   --   PLT 189  --   --     LFT Recent Labs  Lab 06/16/23 2048  AST 39  ALT 19  ALKPHOS 33*  BILITOT 0.9  PROT 5.6*  ALBUMIN 2.9*     Antibiotics: Anti-infectives (From admission, onward)    Start     Dose/Rate Route Frequency Ordered Stop   06/17/23 1000  piperacillin-tazobactam (ZOSYN) IVPB 3.375 g        3.375 g 12.5 mL/hr over 240 Minutes Intravenous Every 8 hours 06/17/23 0608     06/17/23  0430  cefTRIAXone (ROCEPHIN) 1 g in sodium chloride 0.9 % 100 mL IVPB        1 g 200 mL/hr over 30 Minutes Intravenous  Once 06/17/23 0415 06/17/23 0617        DVT prophylaxis: SCDs  Code Status: DNR  Family Communication: No family at bedside   CONSULTS orthopedics   Subjective   Complains of pain in the arm, wants pain medications.   Objective    Physical Examination:  General-appears in no acute distress Heart-S1-S2, regular, no murmur auscultated Lungs-clear to auscultation bilaterally, no wheezing or crackles auscultated Abdomen-soft, nontender, no organomegaly Extremities-left upper extremity in sling Neuro-alert, oriented x3, no focal deficit noted  Status is: Inpatient:          Ramonica Grigg S Francisca Harbuck   [facility] If 7PM-7AM, please contact night-coverage  at [email or web address], Office  [phone]   06/18/2023, 5:18 PM  LOS: 1 day

## 2023-06-19 NOTE — Progress Notes (Signed)
Physical Therapy Treatment Patient Details Name: Lorraine James MRN: [id number] DOB: Apr 03, 1946 Today's Date: 06/19/2023   History of Present Illness Pt is a 77 y/o F presenting to ED on 11/18 after being found down after unwitnessed fall. Imaging reveals L humerus fx with plan to manage nonoperatively in sling. Recently d/c'd from [facility] 1 week ago for confusion, current admission UA positive for infection, she has had persistent encephalopathyl PMH includes HTN, chronic pain, VTE.    PT Comments  Pt greeted resting in bed and agreeable to session with continued progress towards acute goals. Pt continues to demonstrate some confusion, disoriented to date and unable to locate room from hall on return despite very short gait away from entrance. Pt requiring grossly min A for bed mobility, transfers and gait with HHA x1 on R with LUE supported in sling with no overt LOB however increased postural sway laterally to L. Pt requiring cues/prompts to recall weight bearing precautions and sling wear at start of session. Educated pt on importance of continued mobility and time up OOB with pt verbalizing understanding . Current plan remains appropriate to address deficits and maximize functional independence and decrease caregiver burden. Pt continues to benefit from skilled PT services to progress toward functional mobility goals.      If plan is discharge home, recommend the following: Two people to help with walking and/or transfers;A lot of help with bathing/dressing/bathroom;Assistance with cooking/housework;Direct supervision/assist for medications management;Assist for transportation;Help with stairs or ramp for entrance;Direct supervision/assist for financial management;Supervision due to cognitive status   Can travel by private vehicle     No  Equipment Recommendations  Other (comment) (Per accepting facility)    Recommendations for Other Services       Precautions / Restrictions  Precautions Precautions: Fall Precaution Comments: LUE sling at all times Required Braces or Orthoses: Sling Restrictions Weight Bearing Restrictions: Yes LUE Weight Bearing: Non weight bearing     Mobility  Bed Mobility Overal bed mobility: Needs Assistance Bed Mobility: Supine to Sit     Supine to sit: Min assist, HOB elevated, Used rails     General bed mobility comments: light min A to eleavte trunk and scoot out to EOB    Transfers Overall transfer level: Needs assistance Equipment used: 1 person hand held assist Transfers: Sit to/from Stand, Bed to chair/wheelchair/BSC Sit to Stand: Min assist           General transfer comment: light min A to steady on rise    Ambulation/Gait Ambulation/Gait assistance: Min assist Gait Distance (Feet): 18 Feet (+ 50) Assistive device: 1 person hand held assist Gait Pattern/deviations: Step-through pattern Gait velocity: decr     General Gait Details: HHA on R, postural sway to L noted, no ovet LOB noted, pt unable to locate room on retuen despite only turning around [employer] [facility]     Tilt Bed    Modified Rankin (Stroke Patients Only)       Balance Overall balance assessment: Needs assistance Sitting-balance support: Single extremity supported, Feet supported Sitting balance-Leahy Scale: Good     Standing balance support: Single extremity supported, During functional activity Standing balance-Leahy Scale: Poor Standing balance comment: HHA to RUE, LUE supported by sling, pt able to stand stacially without UE support  Cognition Arousal: Alert Behavior During Therapy: WFL for tasks assessed/performed Overall Cognitive Status: Impaired/Different from baseline Area of Impairment: Orientation, Attention, Memory, Safety/judgement, Awareness, Problem solving                 Orientation Level: Disoriented to, Time Current Attention  Level: Focused Memory: Decreased short-term memory   Safety/Judgement: Decreased awareness of safety, Decreased awareness of deficits Awareness: Emergent Problem Solving: Slow processing, Decreased initiation, Difficulty sequencing, Requires verbal cues, Requires tactile cues General Comments: slow processing at times, stating date as Nov 6        Exercises      General Comments General comments (skin integrity, edema, etc.): VSS on RA      Pertinent Vitals/Pain Pain Assessment Pain Assessment: Faces Faces Pain Scale: Hurts little more Pain Location: L arm Pain Descriptors / Indicators: Discomfort, Grimacing, Moaning Pain Intervention(s): Premedicated before session, Monitored during session, Limited activity within patient's tolerance, Repositioned    Home Living                          Prior Function            PT Goals (current goals can now be found in the care plan section) Acute Rehab PT Goals PT Goal Formulation: Patient unable to participate in goal setting Time For Goal Achievement: 07/01/23 Progress towards PT goals: Progressing toward goals    Frequency    Min 1X/week      PT Plan      Co-evaluation              AM-PAC PT "6 Clicks" Mobility   Outcome Measure  Help needed turning from your back to your side while in a flat bed without using bedrails?: A Lot Help needed moving from lying on your back to sitting on the side of a flat bed without using bedrails?: A Lot Help needed moving to and from a bed to a chair (including a wheelchair)?: A Little Help needed standing up from a chair using your arms (e.g., wheelchair or bedside chair)?: A Little Help needed to walk in hospital room?: A Lot Help needed climbing 3-5 steps with a railing? : Total 6 Click Score: 13    End of Session Equipment Utilized During Treatment: Gait belt Activity Tolerance: Patient tolerated treatment well Patient left: in chair;with call bell/phone  within reach;with chair alarm set Nurse Communication: Mobility status PT Visit Diagnosis: Other abnormalities of gait and mobility (R26.89)     Time: 4401-0272 PT Time Calculation (min) (ACUTE ONLY): 23 min  Charges:    $Gait Training: 8-22 mins $Therapeutic Activity: 8-22 mins PT General Charges $$ ACUTE PT VISIT: 1 Visit                     Tobi Bastos R. PTA Acute Rehabilitation Services Office: [phone]   Catalina Antigua 06/19/2023, 11:05 AM

## 2023-06-19 NOTE — Plan of Care (Signed)
  Problem: Clinical Measurements: Goal: Ability to maintain clinical measurements within normal limits will improve Outcome: Progressing Goal: Respiratory complications will improve Outcome: Progressing Goal: Cardiovascular complication will be avoided Outcome: Progressing   Problem: Coping: Goal: Ability to adjust to condition or change in health will improve Outcome: Not Progressing   Problem: Health Behavior/Discharge Planning: Goal: Ability to manage health-related needs will improve Outcome: Not Progressing   Problem: Education: Goal: Knowledge of General Education information will improve Description: Including pain rating scale, medication(s)/side effects and non-pharmacologic comfort measures Outcome: Not Progressing   Problem: Clinical Measurements: Goal: Will remain free from infection Outcome: Not Progressing   Problem: Pain Management: Goal: General experience of comfort will improve Outcome: Not Progressing

## 2023-06-19 NOTE — Progress Notes (Signed)
Occupational Therapy Treatment Patient Details Name: Lorraine James MRN: [id number] DOB: 1946-07-16 Today's Date: 06/19/2023   History of present illness 77 y/o F presenting to ED on 11/18 after being found down after unwitnessed fall. Imaging reveals L humerus fx with plan to manage nonoperatively in sling. Recently d/c'd from [facility] 1 week ago for confusion, current admission UA positive for infection, she has had persistent encephalopathyl PMH includes HTN, chronic pain, VTE.   OT comments  Pt progressing towards established OT goals. Pt agreeable to therapy and verbalizing that she is having ST memory difficulties. Pt participating in AROM/AAROM exercises for hand, wrist, and elbow; completing 10 reps for each joint while seated. Educating pt on gentle seated dangles of LUE (maintaining shoulder adduction). Pt requiring Max A for donning/doffing of shoulder immobilizer. Initiating education on compensatory techniques for UB bathing/dressing. Pt performing functional mobility in hallway with Min A single hand held at RUE. Continue to recommend post-acute rehab and will continue to follow acutely as admitted.       If plan is discharge home, recommend the following:  A little help with walking and/or transfers;A lot of help with bathing/dressing/bathroom;Assistance with cooking/housework;Direct supervision/assist for medications management;Help with stairs or ramp for entrance;Assist for transportation;Direct supervision/assist for financial management   Equipment Recommendations  Other (comment) (Defer to next venue)    Recommendations for Other Services PT consult    Precautions / Restrictions Precautions Precautions: Fall Precaution Comments: LUE sling at all times Required Braces or Orthoses: Sling Restrictions Weight Bearing Restrictions: Yes LUE Weight Bearing: Non weight bearing Other Position/Activity Restrictions: Gentle pendulum swings only. Allowed to come out of  sling for ADLs only if maintaining strict elbow adduction to trunk. Per Charma Igo, PA on 11/21       Mobility Bed Mobility               General bed mobility comments: Seated in recliner upon arrival    Transfers Overall transfer level: Needs assistance Equipment used: 1 person hand held assist Transfers: Sit to/from Stand Sit to Stand: Min assist           General transfer comment: light min A to steady on rise     Balance Overall balance assessment: Needs assistance Sitting-balance support: Single extremity supported, Feet supported Sitting balance-Leahy Scale: Good     Standing balance support: Single extremity supported, During functional activity Standing balance-Leahy Scale: Poor Standing balance comment: HHA to RUE, LUE supported by sling, pt able to stand stacially without UE support                           ADL either performed or assessed with clinical judgement   ADL Overall ADL's : Needs assistance/impaired           Upper Body Bathing Details (indicate cue type and reason): Initating education on UB bathing techniques with use of gentle seated "dangles" to allow for access to axillary area. Pt verablized understanding and demonstrated seated dangle while maintaining shoulder adduction. Will continue to provide education.       Upper Body Dressing Details (indicate cue type and reason): Initiating education on compensatory techniques for UB dressing - dressing LUE first and maintaining no ROM at shoulder. Pt verbalized understanding; however, decreased ST memory and will need further practice and education for carry over.     Toilet Transfer: Minimal assistance;Ambulation (simulated in room)           Functional  mobility during ADLs: Minimal assistance (single hand held) General ADL Comments: Providing education on shoulder precautions and compensatory techniques for UB bathing and dressing. Pt performing functional mobility  in hallway with single hand held A and MIn A.    Extremity/Trunk Assessment Upper Extremity Assessment Upper Extremity Assessment: Left hand dominant;LUE deficits/detail LUE Deficits / Details: Left humerus fx -- Plan non-operative management with NWB in sling. Gentle pendulum swings only. Allowed to come out of sling for ADLs only if maintaining strict elbow adduction to trunk. Per Charma Igo, PA on 11/21. LUE Coordination: decreased fine motor;decreased gross motor   Lower Extremity Assessment Lower Extremity Assessment: Defer to PT evaluation        Vision   Vision Assessment?: No apparent visual deficits   Perception     Praxis      Cognition Arousal: Alert Behavior During Therapy: WFL for tasks assessed/performed Overall Cognitive Status: Impaired/Different from baseline Area of Impairment: Orientation, Attention, Memory, Safety/judgement, Awareness, Problem solving                 Orientation Level: Disoriented to, Time Current Attention Level: Sustained Memory: Decreased short-term memory   Safety/Judgement: Decreased awareness of safety, Decreased awareness of deficits Awareness: Emergent Problem Solving: Slow processing, Decreased initiation, Difficulty sequencing, Requires verbal cues, Requires tactile cues General Comments: Pt able to engage in conversation about her hospitalization and her son and daughter coming to visit today. Pt unable to recall having PT session this AM. Following commands and agreeable to therapy.        Exercises Exercises: Shoulder, Other exercises Shoulder Exercises Pendulum Exercise: 10 reps, Left, Seated ("dangles") Elbow Flexion: AAROM, Left, 10 reps, Seated Elbow Extension: AAROM, Left, 10 reps, Seated Wrist Flexion: AROM, Left, 10 reps, Seated Wrist Extension: AROM, Left, 10 reps, Seated Digit Composite Flexion: AROM, Left, 10 reps, Seated Composite Extension: AROM, Left, 10 reps, Seated Other Exercises Other  Exercises: supination/pronation L forearm. 10x1. seated    Shoulder Instructions Shoulder Instructions Donning/doffing sling/immobilizer: Maximal assistance     General Comments VSS    Pertinent Vitals/ Pain       Pain Assessment Pain Assessment: Faces Faces Pain Scale: Hurts little more Pain Location: L arm Pain Descriptors / Indicators: Discomfort, Grimacing, Moaning Pain Intervention(s): Monitored during session, Repositioned  Home Living                                          Prior Functioning/Environment              Frequency  Min 1X/week        Progress Toward Goals  OT Goals(current goals can now be found in the care plan section)  Progress towards OT goals: Progressing toward goals  Acute Rehab OT Goals OT Goal Formulation: With patient Time For Goal Achievement: 07/02/23 Potential to Achieve Goals: Good ADL Goals Pt Will Perform Upper Body Dressing: with supervision;sitting Pt Will Perform Lower Body Dressing: with supervision;sit to/from stand;sitting/lateral leans Pt Will Transfer to Toilet: ambulating;with supervision;regular height toilet Additional ADL Goal #1: pt will perform bed mobility CGA in prep for ADLs  Plan      Co-evaluation                 AM-PAC OT "6 Clicks" Daily Activity     Outcome Measure   Help from another person eating meals?: A Little Help from another person  taking care of personal grooming?: A Little Help from another person toileting, which includes using toliet, bedpan, or urinal?: A Lot Help from another person bathing (including washing, rinsing, drying)?: A Lot Help from another person to put on and taking off regular upper body clothing?: A Lot Help from another person to put on and taking off regular lower body clothing?: A Lot 6 Click Score: 14    End of Session Equipment Utilized During Treatment: Gait belt;Other (comment) (Shoulder immobilizer)  OT Visit Diagnosis:  Unsteadiness on feet (R26.81);Other abnormalities of gait and mobility (R26.89);Muscle weakness (generalized) (M62.81);History of falling (Z91.81)   Activity Tolerance Patient tolerated treatment well   Patient Left in chair;with call bell/phone within reach;with chair alarm set   Nurse Communication Mobility status        Time: 1610-9604 OT Time Calculation (min): 27 min  Charges: OT General Charges $OT Visit: 1 Visit OT Treatments $Self Care/Home Management : 8-22 mins  Alacia Rehmann MSOT, OTR/L Acute Rehab Office: [phone]  Theodoro Grist Rishik Tubby 06/19/2023, 12:07 PM

## 2023-06-19 NOTE — Progress Notes (Signed)
[facility]  PROGRESS NOTE  Lorraine James WJX:[id number] DOB: [birth year] DOA: 06/16/2023 PCP: Paulina Fusi, MD   Brief HPI:   77 year old woman who presented as a level 1 trauma after falling at home on blood thinners. Full trauma workup revealed a left humeral fracture.  Patient has had persistent encephalopathy since admission.  UA was positive for infection.  Due to concern for airway protection, PCCM was consulted. Did not require intubation She was transferred to medical floor, TRH assumed care on 11/20 She was discharged a week ago from [facility] due to confusion, they could not find a reason except for dehydration. Patient is on gabapentin, oxycodone, baclofen at home    Assessment/Plan:   Acute metabolic encephalopathy -Insetting of UTI, multiple psychotropic pain medications -Resolved at this time -Avoid sedating medications -Ammonia level 13 -EEG showed no epileptiform discharges; showed continuous slow generalized recording, suggestive of moderate diffuse encephalopathy  Chronic pain syndrome -Restart baclofen 5 mg p.o. 3 times daily as needed -Oxycodone at a reduced dose of 5 mg every 6 hours as needed -Hold gabapentin for now  Anemia -Hemoglobin is 7.1 -Check CBC in a.m.  UTI -Started on Zosyn -Urine culture grew yeast -Will discontinue Zosyn  Status post fall, left humerus fracture -Orthopedics consulted -Nonweightbearing in sling -Follow-up Dr. Roda Shutters in 2 to 3 weeks  Diabetes mellitus type 2 -CBG well-controlled -Continue sliding scale insulin with NovoLog  Hypertension -Continue metoprolol 50 mg p.o. twice daily   History of VTE -Patient takes Xarelto at home -Xarelto has been resumed  Medications     atorvastatin  40 mg Oral Daily   insulin aspart  0-9 Units Subcutaneous Q4H   metoprolol tartrate  50 mg Oral BID   rivaroxaban  20 mg Oral QPC supper     Data Reviewed:   CBG:  Recent Labs  Lab 06/18/23 1625 06/18/23 1944  06/18/23 2330 06/19/23 0355 06/19/23 0817  GLUCAP 128* 136* 109* 124* 76    SpO2: 99 %    Vitals:   06/18/23 1940 06/18/23 2328 06/19/23 0350 06/19/23 0815  BP: (!) 154/98 (!) 161/74 (!) 146/69 (!) 145/66  Pulse: (!) 114 87 80 79  Resp: 19   16  Temp: (!) 100.7 F (38.2 C) 99.8 F (37.7 C) 98.9 F (37.2 C) 98.9 F (37.2 C)  TempSrc: Oral Oral Oral Oral  SpO2: 96% 97% 99% 99%  Weight:      Height:          Data Reviewed:  Basic Metabolic Panel: Recent Labs  Lab 06/16/23 2048 06/16/23 2248 06/16/23 2316  NA 141 144 143  K 3.8 3.8 3.7  CL 106 103  --   CO2 27  --   --   GLUCOSE 136* 134*  --   BUN 21 23  --   CREATININE 1.44* 1.30*  --   CALCIUM 8.4*  --   --     CBC: Recent Labs  Lab 06/16/23 2048 06/16/23 2248 06/16/23 2316  WBC 8.0  --   --   HGB 7.3* 7.8* 7.1*  HCT 23.5* 23.0* 21.0*  MCV 101.3*  --   --   PLT 189  --   --     LFT Recent Labs  Lab 06/16/23 2048  AST 39  ALT 19  ALKPHOS 33*  BILITOT 0.9  PROT 5.6*  ALBUMIN 2.9*     Antibiotics: Anti-infectives (From admission, onward)    Start     Dose/Rate Route Frequency Ordered Stop  06/17/23 1000  piperacillin-tazobactam (ZOSYN) IVPB 3.375 g  Status:  Discontinued        3.375 g 12.5 mL/hr over 240 Minutes Intravenous Every 8 hours 06/17/23 0608 06/19/23 0846   06/17/23 0430  cefTRIAXone (ROCEPHIN) 1 g in sodium chloride 0.9 % 100 mL IVPB        1 g 200 mL/hr over 30 Minutes Intravenous  Once 06/17/23 0415 06/17/23 0617        DVT prophylaxis: SCDs  Code Status: DNR  Family Communication: No family at bedside   CONSULTS orthopedics   Subjective   Pain well-controlled on pain medications.   Objective    Physical Examination:  General-appears in no acute distress Heart-S1-S2, regular, no murmur auscultated Lungs-clear to auscultation bilaterally, no wheezing or crackles auscultated Abdomen-soft, nontender, no organomegaly Extremities-left upper extremity  in sling Neuro-alert, oriented x3, no focal deficit noted  Status is: Inpatient:          Lorraine James   [facility] If 7PM-7AM, please contact night-coverage at [email or web address], Office  [phone]   06/19/2023, 10:25 AM  LOS: 2 days

## 2023-06-19 NOTE — TOC Progression Note (Signed)
Transition of Care [facility]) - Progression Note    Patient Details  Name: Lorraine James MRN: [id number] Date of Birth: Oct 19, 1945  Transition of Care [facility]) CM/SW Contact  Baldemar Lenis, [place] Phone Number: 06/19/2023, 2:57 PM  Clinical Narrative:   CSW spoke with son, Lorraine James, about SNF options. Lorraine James asked for time to discuss with patient and family before making decision, will call CSW back. CSW to follow.    Expected Discharge Plan: Skilled Nursing Facility Barriers to Discharge: English as a second language teacher, Continued Medical Work up  Expected Discharge Plan and Services     Post Acute Care Choice: Skilled Nursing Facility Living arrangements for the past 2 months: Single Family Home                                       Social Determinants of Health (SDOH) Interventions SDOH Screenings   Food Insecurity: No Food Insecurity (06/17/2023)  Housing: Low Risk  (06/17/2023)  Transportation Needs: No Transportation Needs (06/17/2023)  Utilities: Not At Risk (06/17/2023)  Tobacco Use: Low Risk  (06/19/2023)    Readmission Risk Interventions     No data to display

## 2023-06-19 NOTE — Progress Notes (Signed)
Speech Language Pathology Treatment: Dysphagia  Lorraine James Details Name: Lorraine James MRN: [id number] DOB: [birth year] Today's Date: 06/19/2023 Time: 3244-0102 SLP Time Calculation (min) (ACUTE ONLY): 8 min  Assessment / Plan / Recommendation Clinical Impression  Pt's swallowing appears to be grossly functional with no overt signs of dysphagia or aspiration observed. Subjectively, Lorraine James says Lorraine James thinks Lorraine James was having some difficulty at one point but says that this has resolved. Lorraine James prefers that Lorraine James be advanced to regular solids. SLP will upgrade order. Pt and family do not think Lorraine James needs any additional f/u after diet advancement and this is likely appropriate. SLP to sign off acutely. Please reorder if acute needs arise.    HPI HPI: Lorraine James is a 77 y.o. female with PMH: HTN, intermittent confusion, chronic pain. Lorraine James presented to the hospital on 06/17/23 as a level 1 trauma after falling at home. (is on blood thinners) Lorraine James lives with her son and per his report, Lorraine James started getting confused day prior to admission and day of admission he heard a big thud and saw her down in the kitchen. Lorraine James was discharged from [facility] a week ago for confusion and at that time, only explanation medically was dehydration. For current admission,, UA was postive for infection, Lorraine James has had persistent encephalopathy and trauma workup revealed a left humeral fracture. MRI brain negative for acute intracranial abnormality but did show extensive chronic small vessel ischemia. Lorraine James was made NPO and swallow evaluation ordered.      SLP Plan  All goals met      Recommendations for follow up therapy are one component of a multi-disciplinary discharge planning process, led by the attending physician.  Recommendations may be updated based on Lorraine James status, additional functional criteria and insurance authorization.    Recommendations  Diet recommendations: Regular;Thin liquid Liquids provided via: Cup;Straw Medication  Administration: Whole meds with liquid Supervision: Lorraine James able to self feed;Intermittent supervision to cue for compensatory strategies Compensations: Slow rate;Small sips/bites Postural Changes and/or Swallow Maneuvers: Seated upright 90 degrees                  Oral care BID   PRN Dysphagia, unspecified (R13.10)     All goals met     Mahala Menghini., M.A. CCC-SLP Acute Rehabilitation Services Office [phone]  Secure chat preferred   06/19/2023, 12:18 PM

## 2023-06-19 NOTE — Plan of Care (Signed)
  Problem: Education: Goal: Ability to describe self-care measures that may prevent or decrease complications (Diabetes Survival Skills Education) will improve Outcome: Progressing   Problem: Coping: Goal: Ability to adjust to condition or change in health will improve Outcome: Progressing   Problem: Metabolic: Goal: Ability to maintain appropriate glucose levels will improve Outcome: Progressing   Problem: Nutritional: Goal: Maintenance of adequate nutrition will improve Outcome: Progressing   Problem: Skin Integrity: Goal: Risk for impaired skin integrity will decrease Outcome: Progressing   Problem: Tissue Perfusion: Goal: Adequacy of tissue perfusion will improve Outcome: Progressing   Problem: Education: Goal: Knowledge of General Education information will improve Description: Including pain rating scale, medication(s)/side effects and non-pharmacologic comfort measures Outcome: Progressing

## 2023-06-20 LAB — CBC
HCT: 24.8 % — ABNORMAL LOW (ref 36.0–46.0)
Hemoglobin: 8 g/dL — ABNORMAL LOW (ref 12.0–15.0)
MCH: 30.9 pg (ref 26.0–34.0)
MCHC: 32.3 g/dL (ref 30.0–36.0)
WBC: 6.6 10*3/uL (ref 4.0–10.5)

## 2023-06-20 LAB — GLUCOSE, CAPILLARY
Glucose-Capillary: 84 mg/dL (ref 70–99)
Glucose-Capillary: 103 mg/dL — ABNORMAL HIGH (ref 70–99)

## 2023-06-20 LAB — BASIC METABOLIC PANEL
Creatinine, Ser: 0.7 mg/dL (ref 0.44–1.00)
Glucose, Bld: 110 mg/dL — ABNORMAL HIGH (ref 70–99)
Sodium: 138 mmol/L (ref 135–145)

## 2023-06-20 MED ORDER — POTASSIUM CHLORIDE 10 MEQ/100ML IV SOLN
10.0000 meq | INTRAVENOUS | Status: DC
Start: 2023-06-20 — End: 2023-06-20

## 2023-06-20 NOTE — Care Management Important Message (Signed)
Important Message  Patient Details  Name: Lorraine James MRN: [id number] Date of Birth: 1945/12/07   Important Message Given:  Yes - Medicare IM     Dorena Bodo 06/20/2023, 3:34 PM

## 2023-06-20 NOTE — Plan of Care (Signed)
  Problem: Metabolic: Goal: Ability to maintain appropriate glucose levels will improve Outcome: Progressing   Problem: Clinical Measurements: Goal: Ability to maintain clinical measurements within normal limits will improve Outcome: Progressing Goal: Will remain free from infection Outcome: Progressing Goal: Respiratory complications will improve Outcome: Progressing Goal: Cardiovascular complication will be avoided Outcome: Progressing   Problem: Elimination: Goal: Will not experience complications related to bowel motility Outcome: Progressing Goal: Will not experience complications related to urinary retention Outcome: Progressing   Problem: Coping: Goal: Ability to adjust to condition or change in health will improve Outcome: Not Progressing   Problem: Health Behavior/Discharge Planning: Goal: Ability to manage health-related needs will improve Outcome: Not Progressing   Problem: Education: Goal: Knowledge of General Education information will improve Description: Including pain rating scale, medication(s)/side effects and non-pharmacologic comfort measures Outcome: Not Progressing   Problem: Pain Management: Goal: General experience of comfort will improve Outcome: Not Progressing

## 2023-06-20 NOTE — TOC Progression Note (Addendum)
Transition of Care [facility]) - Progression Note    Patient Details  Name: Lorraine James MRN: [id number] Date of Birth: August 05, 1945  [facility] CM/SW Contact  Baldemar Lenis, [place] Phone Number: 06/20/2023, 11:33 AM  Clinical Narrative:   CSW spoke with Judie Grieve that they would like to choose [facility]. [facility] has a bed available. CSW requested CMA to upload clinicals for insurance authorization. CSW to follow.  UPDATE: Authorization received, patient can admit tomorrow. CSW spoke with son to provide update. CSW to follow.   Expected Discharge Plan: Skilled Nursing Facility Barriers to Discharge: English as a second language teacher, Continued Medical Work up  Expected Discharge Plan and Services     Post Acute Care Choice: Skilled Nursing Facility Living arrangements for the past 2 months: Single Family Home                                       Social Determinants of Health (SDOH) Interventions SDOH Screenings   Food Insecurity: No Food Insecurity (06/17/2023)  Housing: Low Risk  (06/17/2023)  Transportation Needs: No Transportation Needs (06/17/2023)  Utilities: Not At Risk (06/17/2023)  Tobacco Use: Low Risk  (06/19/2023)    Readmission Risk Interventions     No data to display

## 2023-06-20 NOTE — Progress Notes (Signed)
[facility]  PROGRESS NOTE  Lorraine James WUJ:[id number] DOB: [birth year] DOA: 06/16/2023 PCP: Paulina Fusi, MD   Brief HPI:   77 year old woman who presented as a level 1 trauma after falling at home on blood thinners. Full trauma workup revealed a left humeral fracture.  Patient has had persistent encephalopathy since admission.  UA was positive for infection.  Due to concern for airway protection, PCCM was consulted. Did not require intubation She was transferred to medical floor, [facility] assumed care on 11/20 She was discharged a week ago from [facility] due to confusion, they could not find a reason except for dehydration. Patient is on gabapentin, oxycodone, baclofen at home    Assessment/Plan:   Acute metabolic encephalopathy -Insetting of UTI, multiple psychotropic pain medications -Resolved at this time -Avoid sedating medications -Ammonia level 13 -EEG showed no epileptiform discharges; showed continuous slow generalized recording, suggestive of moderate diffuse encephalopathy  Chronic pain syndrome -Restart baclofen 5 mg p.o. 3 times daily as needed -Oxycodone at a reduced dose of 5 mg every 6 hours as needed -Hold gabapentin for now  Hypokalemia -Potassium was 2.7 -Replace potassium with IV KCl 10 mill equivalents x 2 -K-Lor 40 mg p.o. twice daily for 2 doses  Anemia -Hemoglobin is up to 8.0  Loose stools -Resolved after 1 dose of Imodium -Will continue to monitor  UTI -Started on Zosyn -Urine culture grew yeast -Will discontinue Zosyn  Status post fall, left humerus fracture -Orthopedics consulted -Nonweightbearing in sling -Follow-up Dr. Roda Shutters in 2 to 3 weeks  Diabetes mellitus type 2 -CBG well-controlled -Continue sliding scale insulin with NovoLog  Hypertension -Continue metoprolol 50 mg p.o. twice daily   History of VTE -Patient takes Xarelto at home -Xarelto has been resumed  Medications     atorvastatin  40 mg Oral Daily    insulin aspart  0-9 Units Subcutaneous Q4H   metoprolol tartrate  50 mg Oral BID   potassium chloride  40 mEq Oral BID   rivaroxaban  20 mg Oral QPC supper     Data Reviewed:   CBG:  Recent Labs  Lab 06/19/23 1619 06/19/23 1951 06/19/23 2324 06/20/23 0408 06/20/23 0812  GLUCAP 114* 114* 96 88 120*    SpO2: 98 %    Vitals:   06/20/23 0300 06/20/23 0404 06/20/23 0700 06/20/23 0814  BP:  (!) 164/69  (!) (P) 142/74  Pulse:  83  (P) 93  Resp: 20 18 18    Temp:  99.3 F (37.4 C)  (P) 99.4 F (37.4 C)  TempSrc:  Axillary  (P) Axillary  SpO2:  98%    Weight:      Height:          Data Reviewed:  Basic Metabolic Panel: Recent Labs  Lab 06/16/23 2048 06/16/23 2248 06/16/23 2316 06/20/23 0605  NA 141 144 143 138  K 3.8 3.8 3.7 2.7*  CL 106 103  --  103  CO2 27  --   --  26  GLUCOSE 136* 134*  --  110*  BUN 21 23  --  11  CREATININE 1.44* 1.30*  --  0.70  CALCIUM 8.4*  --   --  8.8*    CBC: Recent Labs  Lab 06/16/23 2048 06/16/23 2248 06/16/23 2316 06/20/23 0605  WBC 8.0  --   --  6.6  HGB 7.3* 7.8* 7.1* 8.0*  HCT 23.5* 23.0* 21.0* 24.8*  MCV 101.3*  --   --  95.8  PLT 189  --   --  214    LFT Recent Labs  Lab 06/16/23 2048  AST 39  ALT 19  ALKPHOS 33*  BILITOT 0.9  PROT 5.6*  ALBUMIN 2.9*     Antibiotics: Anti-infectives (From admission, onward)    Start     Dose/Rate Route Frequency Ordered Stop   06/17/23 1000  piperacillin-tazobactam (ZOSYN) IVPB 3.375 g  Status:  Discontinued        3.375 g 12.5 mL/hr over 240 Minutes Intravenous Every 8 hours 06/17/23 0608 06/19/23 0846   06/17/23 0430  cefTRIAXone (ROCEPHIN) 1 g in sodium chloride 0.9 % 100 mL IVPB        1 g 200 mL/hr over 30 Minutes Intravenous  Once 06/17/23 0415 06/17/23 0617        DVT prophylaxis: SCDs  Code Status: DNR  Family Communication: No family at bedside   CONSULTS orthopedics   Subjective   Had diarrhea yesterday, resolved after 1 dose of Imodium.   No diarrhea overnight.  Potassium dropped to 2.7 this morning.   Objective    Physical Examination:  General-appears in no acute distress Heart-S1-S2, regular, no murmur auscultated Lungs-clear to auscultation bilaterally, no wheezing or crackles auscultated Abdomen-soft, nontender, no organomegaly Extremities-no edema in the lower extremities Neuro-alert, oriented x3, no focal deficit noted  Status is: Inpatient:          Meredeth Ide   [facility] If 7PM-7AM, please contact night-coverage at [email or web address], Office  [phone]   06/20/2023, 8:43 AM  LOS: 3 days

## 2023-06-20 NOTE — Progress Notes (Addendum)
Critical Potassium was called to this RN at 0750 of 2.7. Physician was called and messaged. Ordered placed and started.

## 2023-06-20 NOTE — Progress Notes (Signed)
Occupational Therapy Treatment Patient Details Name: Lorraine James MRN: [id number] DOB: 09-Jan-1946 Today's Date: 06/20/2023   History of present illness 77 y/o F presenting to ED on 11/18 after being found down after unwitnessed fall. Imaging reveals L humerus fx with plan to manage nonoperatively in sling. Recently d/c'd from [facility] 1 week ago for confusion, current admission UA positive for infection, she has had persistent encephalopathyl PMH includes HTN, chronic pain, VTE.   OT comments  Pt is making solid progress towards their acute OT goals. Upon arrival, pt reported that she was soiled and needed to bathe - session focused on progressing ADLs. Overall she was able to complete bed mobility with min A and cues to WB to L shoulder. Pt tolerated prolonged static standing for dependent rear peri care, and was able to complete frontal peri care in standing with CGA. Pt mobilized a short distance with close CGA and cues for safety. Throughout, pt noted to continue to have impaired memory, insight and awareness however cognition for command following and basic ADLs was [place]. OT to continue to follow acutely to facilitate progress towards established goals. Pt will continue to benefit from skilled inpatient follow up therapy, <3 hours/day.        If plan is discharge home, recommend the following:  A little help with walking and/or transfers;A lot of help with bathing/dressing/bathroom;Assistance with cooking/housework;Direct supervision/assist for medications management;Help with stairs or ramp for entrance;Assist for transportation;Direct supervision/assist for financial management   Equipment Recommendations  Other (comment)    Recommendations for Other Services PT consult    Precautions / Restrictions Precautions Precautions: Fall Precaution Comments: LUE sling at all times Required Braces or Orthoses: Sling Restrictions Weight Bearing Restrictions: No LUE Weight Bearing:  Non weight bearing Other Position/Activity Restrictions: Gentle pendulum swings only. Allowed to come out of sling for ADLs only if maintaining strict elbow adduction to trunk. Per Charma Igo, PA on 11/21       Mobility Bed Mobility Overal bed mobility: Needs Assistance Bed Mobility: Supine to Sit, Sit to Supine     Supine to sit: Min assist, HOB elevated Sit to supine: Min assist        Transfers Overall transfer level: Needs assistance Equipment used: 1 person hand held assist Transfers: Sit to/from Stand Sit to Stand: Contact guard assist           General transfer comment: RW in front of pt for RUE external support in static standing     Balance Overall balance assessment: Needs assistance Sitting-balance support: Single extremity supported, Feet supported Sitting balance-Leahy Scale: Good     Standing balance support: Single extremity supported, During functional activity Standing balance-Leahy Scale: Poor                             ADL either performed or assessed with clinical judgement   ADL Overall ADL's : Needs assistance/impaired     Grooming: Set up;Sitting                   Toilet Transfer: Contact guard assist;Ambulation Toilet Transfer Details (indicate cue type and reason): short ambulation with RUE supported on RW Toileting- Clothing Manipulation and Hygiene: Moderate assistance;Sit to/from stand Toileting - Clothing Manipulation Details (indicate cue type and reason): needed assist for rear peri care, pt able to complete frontal peri care in standing with CGA     Functional mobility during ADLs: Contact guard assist;Rolling walker (2  wheels) General ADL Comments: soiled upon arrival, pt expressed need to bathe. LUE maintained in sling throughout. Pt able to tolerate LB ADLs in standing    Extremity/Trunk Assessment Upper Extremity Assessment Upper Extremity Assessment: LUE deficits/detail LUE Deficits / Details:  Left humerus fx -- Plan non-operative management with NWB in sling. Gentle pendulum swings only. Allowed to come out of sling for ADLs only if maintaining strict elbow adduction to trunk. Per Charma Igo, PA on 11/21. LUE Coordination: decreased fine motor;decreased gross motor   Lower Extremity Assessment Lower Extremity Assessment: Defer to PT evaluation        Vision   Vision Assessment?: No apparent visual deficits Additional Comments: glasses   Perception Perception Perception: Not tested   Praxis Praxis Praxis: Not tested    Cognition Arousal: Alert Behavior During Therapy: WFL for tasks assessed/performed Overall Cognitive Status: Impaired/Different from baseline Area of Impairment: Orientation, Attention, Memory, Safety/judgement, Awareness, Problem solving                 Orientation Level: Disoriented to, Time Current Attention Level: Sustained Memory: Decreased short-term memory Following Commands: Follows one step commands with increased time Safety/Judgement: Decreased awareness of safety, Decreased awareness of deficits Awareness: Emergent Problem Solving: Slow processing, Decreased initiation, Difficulty sequencing, Requires verbal cues, Requires tactile cues General Comments: WFL for basic command following and ADLs. Continues to have impiared memory and insight.        Exercises      Shoulder Instructions       General Comments VSS    Pertinent Vitals/ Pain       Pain Assessment Pain Assessment: Faces Faces Pain Scale: Hurts even more Pain Location: L arm Pain Descriptors / Indicators: Discomfort, Grimacing, Moaning Pain Intervention(s): Limited activity within patient's tolerance, Monitored during session  Home Living                                          Prior Functioning/Environment              Frequency  Min 1X/week        Progress Toward Goals  OT Goals(current goals can now be found in the  care plan section)  Progress towards OT goals: Progressing toward goals  Acute Rehab OT Goals Patient Stated Goal: to go home OT Goal Formulation: With patient Time For Goal Achievement: 07/02/23 Potential to Achieve Goals: Good ADL Goals Pt Will Perform Upper Body Dressing: with supervision;sitting Pt Will Perform Lower Body Dressing: with supervision;sit to/from stand;sitting/lateral leans Pt Will Transfer to Toilet: ambulating;with supervision;regular height toilet Additional ADL Goal #1: pt will perform bed mobility CGA in prep for ADLs  Plan      Co-evaluation                 AM-PAC OT "6 Clicks" Daily Activity     Outcome Measure   Help from another person eating meals?: A Little Help from another person taking care of personal grooming?: A Little Help from another person toileting, which includes using toliet, bedpan, or urinal?: A Little Help from another person bathing (including washing, rinsing, drying)?: A Lot Help from another person to put on and taking off regular upper body clothing?: A Lot Help from another person to put on and taking off regular lower body clothing?: A Lot 6 Click Score: 15    End of Session Equipment Utilized During  Treatment: Gait belt;Rolling walker (2 wheels)  OT Visit Diagnosis: Unsteadiness on feet (R26.81);Other abnormalities of gait and mobility (R26.89);Muscle weakness (generalized) (M62.81);History of falling (Z91.81)   Activity Tolerance Patient tolerated treatment well   Patient Left in bed;with bed alarm set;with call bell/phone within reach   Nurse Communication Mobility status        Time: 1320-1340 OT Time Calculation (min): 20 min  Charges: OT General Charges $OT Visit: 1 Visit OT Treatments $Self Care/Home Management : 8-22 mins  Derenda Mis, OTR/L Acute Rehabilitation Services Office [phone] Secure Chat Communication Preferred   Donia Pounds 06/20/2023, 2:07 PM

## 2023-06-21 LAB — CBC
HCT: 27.1 % — ABNORMAL LOW (ref 36.0–46.0)
MCV: 95.1 fL (ref 80.0–100.0)
Platelets: 257 10*3/uL (ref 150–400)
nRBC: 0 % (ref 0.0–0.2)

## 2023-06-21 LAB — GASTROINTESTINAL PANEL BY PCR, STOOL (REPLACES STOOL CULTURE)
Adenovirus F40/41: NOT DETECTED
Astrovirus: NOT DETECTED
Campylobacter species: NOT DETECTED
Cryptosporidium: NOT DETECTED
Enteropathogenic E coli (EPEC): NOT DETECTED
Enterotoxigenic E coli (ETEC): NOT DETECTED
Rotavirus A: NOT DETECTED
Shiga like toxin producing E coli (STEC): NOT DETECTED
Shigella/Enteroinvasive E coli (EIEC): NOT DETECTED

## 2023-06-21 LAB — GLUCOSE, CAPILLARY
Glucose-Capillary: 83 mg/dL (ref 70–99)
Glucose-Capillary: 80 mg/dL (ref 70–99)
Glucose-Capillary: 87 mg/dL (ref 70–99)

## 2023-06-21 LAB — BASIC METABOLIC PANEL
Anion gap: 12 (ref 5–15)
CO2: 21 mmol/L — ABNORMAL LOW (ref 22–32)
Chloride: 106 mmol/L (ref 98–111)
Creatinine, Ser: 0.76 mg/dL (ref 0.44–1.00)
Glucose, Bld: 83 mg/dL (ref 70–99)
Sodium: 139 mmol/L (ref 135–145)

## 2023-06-21 NOTE — Plan of Care (Signed)
  Problem: Clinical Measurements: Goal: Will remain free from infection Outcome: Progressing   Problem: Activity: Goal: Risk for activity intolerance will decrease Outcome: Progressing   Problem: Elimination: Goal: Will not experience complications related to urinary retention Outcome: Progressing   Problem: Metabolic: Goal: Ability to maintain appropriate glucose levels will improve Outcome: Not Progressing   Problem: Education: Goal: Knowledge of General Education information will improve Description: Including pain rating scale, medication(s)/side effects and non-pharmacologic comfort measures Outcome: Not Progressing   Problem: Elimination: Goal: Will not experience complications related to bowel motility Outcome: Not Progressing   Problem: Pain Management: Goal: General experience of comfort will improve Outcome: Not Progressing

## 2023-06-21 NOTE — Progress Notes (Addendum)
[facility]  PROGRESS NOTE  Lorraine James JWJ:[id number] DOB: 06/26/1946 DOA: 06/16/2023 PCP: Paulina Fusi, MD   Brief HPI:   77 year old woman who presented as a level 1 trauma after falling at home on blood thinners. Full trauma workup revealed a left humeral fracture.  Patient has had persistent encephalopathy since admission.  UA was positive for infection.  Due to concern for airway protection, PCCM was consulted. Did not require intubation She was transferred to medical floor, TR[facility] assumed care on 11/20 She was discharged a week ago from [place] [facility] due to confusion, they could not find a reason except for dehydration. Patient is on gabapentin, oxycodone, baclofen at home    Assessment/Plan:   Acute metabolic encephalopathy -Insetting of UTI, multiple psychotropic pain medications -Resolved at this time -Avoid sedating medications -Ammonia level 13 -EEG showed no epileptiform discharges; showed continuous slow generalized recording, suggestive of moderate diffuse encephalopathy  Chronic pain syndrome -Restart baclofen 5 mg p.o. 3 times daily as needed -Oxycodone at a reduced dose of 5 mg every 6 hours as needed -Hold gabapentin for now  Hypokalemia -Replete  Anemia -Hemoglobin is up to 8.0  Loose stools -Resolved after 1 dose of Imodium -Will continue to monitor -Follow GI pathogen panel  UTI -Started on Zosyn -Urine culture grew yeast -Will discontinue Zosyn -Start Diflucan 200 mg p.o. daily for 5 days  Status post fall, left humerus fracture -Orthopedics consulted -Nonweightbearing in sling -Follow-up Dr. Roda Shutters in 2 to 3 weeks  Diabetes mellitus type 2 -CBG well-controlled -Continue sliding scale insulin with NovoLog  Hypertension -Continue metoprolol 50 mg p.o. 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 -Xarelto has been resumed  Medications     atorvastatin  40 mg Oral Daily   fluconazole  200 mg Oral Daily   insulin  aspart  0-9 Units Subcutaneous Q4H   metoprolol tartrate  50 mg Oral BID   rivaroxaban  20 mg Oral QPC supper     Data Reviewed:   CBG:  Recent Labs  Lab 06/20/23 2352 06/21/23 0347 06/21/23 0804 06/21/23 0824 06/21/23 1144  GLUCAP 80 83 67* 80 82    SpO2: 99 %    Vitals:   06/21/23 0510 06/21/23 0804 06/21/23 0942 06/21/23 1135  BP:  (!) 167/83 (!) 167/83 (!) 169/80  Pulse:  100 99 87  Resp:  17    Temp:  99 F (37.2 C)  98.3 F (36.8 C)  TempSrc:  Oral  Oral  SpO2:  100%  99%  Weight: 81.1 kg     Height:          Data Reviewed:  Basic Metabolic Panel: Recent Labs  Lab 06/16/23 2048 06/16/23 2248 06/16/23 2316 06/20/23 0605 06/20/23 0929 06/21/23 0755  NA 141 144 143 138  --  139  K 3.8 3.8 3.7 2.7*  --  4.0  CL 106 103  --  103  --  106  CO2 27  --   --  26  --  21*  GLUCOSE 136* 134*  --  110*  --  83  BUN 21 23  --  11  --  10  CREATININE 1.44* 1.30*  --  0.70  --  0.76  CALCIUM 8.4*  --   --  8.8*  --  9.2  MG  --   --   --   --  1.5*  --     CBC: Recent Labs  Lab 06/16/23 2048 06/16/23 2248  06/16/23 2316 06/20/23 0605 06/21/23 0755  WBC 8.0  --   --  6.6 7.3  HGB 7.3* 7.8* 7.1* 8.0* 9.0*  HCT 23.5* 23.0* 21.0* 24.8* 27.1*  MCV 101.3*  --   --  95.8 95.1  PLT 189  --   --  214 257    LFT Recent Labs  Lab 06/16/23 2048  AST 39  ALT 19  ALKPHOS 33*  BILITOT 0.9  PROT 5.6*  ALBUMIN 2.9*     Antibiotics: Anti-infectives (From admission, onward)    Start     Dose/Rate Route Frequency Ordered Stop   06/21/23 1115  fluconazole (DIFLUCAN) tablet 200 mg  Status:  Discontinued        200 mg Oral Daily 06/21/23 1023 06/21/23 1027   06/21/23 1115  fluconazole (DIFLUCAN) tablet 200 mg        200 mg Oral Daily 06/21/23 1027 06/26/23 0959   06/21/23 1045  fluconazole (DIFLUCAN) tablet 100 mg  Status:  Discontinued        100 mg Oral Daily 06/21/23 0952 06/21/23 1022   06/17/23 1000  piperacillin-tazobactam (ZOSYN) IVPB 3.375 g   Status:  Discontinued        3.375 g 12.5 mL/hr over 240 Minutes Intravenous Every 8 hours 06/17/23 0608 06/19/23 0846   06/17/23 0430  cefTRIAXone (ROCEPHIN) 1 g in sodium chloride 0.9 % 100 mL IVPB        1 g 200 mL/hr over 30 Minutes Intravenous  Once 06/17/23 0415 06/17/23 0617        DVT prophylaxis: SCDs  Code Status: DNR  Family Communication: No family at bedside   CONSULTS orthopedics   Subjective   Continues to have loose stools, improved with Imodium.  Patient says that she had 1-2 loose stools for past 1 month.  She is supposed to follow-up with gastroenterology outpatient.  Denies abdominal pain.  No fever.  GI pathogen panel obtained this morning.  Objective    Physical Examination:  General-appears in no acute distress Heart-S1-S2, regular, no murmur auscultated Lungs-clear to auscultation bilaterally, no wheezing or crackles auscultated Abdomen-soft, nontender, no organomegaly Extremities-left upper extremity in sling Neuro-alert, oriented x3, no focal deficit noted  Status is: Inpatient:          Lorraine James Lorraine James   [facility] If 7PM-7AM, please contact night-coverage at [email or web address], Office  [phone]   06/21/2023, 1:35 PM  LOS: 4 days

## 2023-06-21 NOTE — Progress Notes (Signed)
Patient refused 0000 blood sugar check education provided, explained her last BS was 87 and we would like to ensure it is within range patient still refused stating she is ok right now. Explained her next BS check is at 0400 and we will need to assess her at that time pt verbalized understanding.

## 2023-06-22 LAB — CBC
HCT: 26 % — ABNORMAL LOW (ref 36.0–46.0)
Hemoglobin: 8.5 g/dL — ABNORMAL LOW (ref 12.0–15.0)
MCH: 30.8 pg (ref 26.0–34.0)
MCHC: 32.7 g/dL (ref 30.0–36.0)
MCV: 94.2 fL (ref 80.0–100.0)
RDW: 15.9 % — ABNORMAL HIGH (ref 11.5–15.5)
WBC: 7.8 10*3/uL (ref 4.0–10.5)

## 2023-06-22 LAB — GLUCOSE, CAPILLARY
Glucose-Capillary: 73 mg/dL (ref 70–99)
Glucose-Capillary: 142 mg/dL — ABNORMAL HIGH (ref 70–99)
Glucose-Capillary: 91 mg/dL (ref 70–99)

## 2023-06-22 NOTE — Progress Notes (Signed)
Central tele called due to patient leads off; I entered the room and Lorraine James was lying  sideways in the bed she had taken her left arm out of the sling, removed her right hand IV, removed her purwick and tele leads. She is stating she is home trying to make coffee in her kitchen I attempted to reorient her several times to no avail. I was able to get her straightened out in bed, but she became verbally aggressive and trying to swing at this nurse when I attempted to replace her sling and gown on her left arm. She also refused to allow me to replace her IV and purwick at this time. She is adamant that she is at her home and we (staff) are trespassing. I explained we are only trying to keep her safe I explained I will allow her to rest and check back in to see if she needs anything and if she is still comfortable, she said thank you and I exited her room.

## 2023-06-22 NOTE — Progress Notes (Signed)
Mrs. Atamian was heard screaming help me from her room I entered and she was lying sideways in the bed reporting she is wet, I called for assistance and myself and Daniah, NT bathe her, changed her gown and all linens. She allowed [place] to replace her purwick but she is still refusing to put back on her sling and to have her IV replaced. I provided her with 2 warm blankets per her request she denied any other needs or discomfort at this time.

## 2023-06-22 NOTE — Progress Notes (Signed)
Occupational Therapy Treatment Patient Details Name: Lorraine James MRN: [id number] DOB: Aug 10, 1945 Today's Date: 06/22/2023   History of present illness 77 y/o F presenting to ED on 11/18 after being found down after unwitnessed fall. Imaging reveals L humerus fx with plan to manage nonoperatively in sling. Recently d/c'd from [facility] 1 week ago for confusion, current admission UA positive for infection, she has had persistent encephalopathyl PMH includes HTN, chronic pain, VTE.   OT comments  Pt up in chair, aware she had bowel incontinence. Min assist to stand, total assist to remove mesh panties and for pericare in standing and finished in supine for thoroughness and replacement of purewick. Max assist to change soiled gown. Educated pt in positioning L UE in bed. Pt left in bed at end of session with needs met and alarm activated. Patient will benefit from continued inpatient follow up therapy, <3 hours/day.       If plan is discharge home, recommend the following:  A little help with walking and/or transfers;A lot of help with bathing/dressing/bathroom;Assistance with cooking/housework;Direct supervision/assist for medications management;Help with stairs or ramp for entrance;Assist for transportation;Direct supervision/assist for financial management   Equipment Recommendations  Other (comment) (defer)    Recommendations for Other Services      Precautions / Restrictions Precautions Precautions: Fall Precaution Comments: LUE sling at all times Required Braces or Orthoses: Sling Restrictions Weight Bearing Restrictions: Yes LUE Weight Bearing: Non weight bearing Other Position/Activity Restrictions: Gentle pendulum swings only. Allowed to come out of sling for ADLs only if maintaining strict elbow adduction to trunk. Per Charma Igo, PA on 11/21       Mobility Bed Mobility Overal bed mobility: Needs Assistance Bed Mobility: Sit to Supine       Sit to supine:  Mod assist   General bed mobility comments: assist for LEs back into bed    Transfers Overall transfer level: Needs assistance Equipment used: 1 person hand held assist Transfers: Sit to/from Stand, Bed to chair/wheelchair/BSC Sit to Stand: Min assist     Step pivot transfers: Min assist     General transfer comment: chair to bed     Balance Overall balance assessment: Needs assistance   Sitting balance-Leahy Scale: Good     Standing balance support: Single extremity supported, During functional activity Standing balance-Leahy Scale: Poor                             ADL either performed or assessed with clinical judgement   ADL Overall ADL's : Needs assistance/impaired                 Upper Body Dressing : Maximal assistance;Bed level           Toileting- Clothing Manipulation and Hygiene: Total assistance;Sit to/from stand;Bed level Toileting - Clothing Manipulation Details (indicate cue type and reason): pt with bowel incontinence     Functional mobility during ADLs: Minimal assistance      Extremity/Trunk Assessment              Vision       Perception     Praxis      Cognition Arousal: Alert Behavior During Therapy: WFL for tasks assessed/performed Overall Cognitive Status: Impaired/Different from baseline Area of Impairment: Memory                     Memory: Decreased short-term memory  General Comments: pt repeatedly stating she is going home and her children will care for her when the plan is for SNF        Exercises      Shoulder Instructions       General Comments      Pertinent Vitals/ Pain       Pain Assessment Pain Assessment: Faces Faces Pain Scale: Hurts little more Pain Location: L arm Pain Descriptors / Indicators: Discomfort, Grimacing, Moaning Pain Intervention(s): Repositioned  Home Living                                          Prior  Functioning/Environment              Frequency  Min 1X/week        Progress Toward Goals  OT Goals(current goals can now be found in the care plan section)  Progress towards OT goals: Progressing toward goals  Acute Rehab OT Goals OT Goal Formulation: With patient Time For Goal Achievement: 07/02/23 Potential to Achieve Goals: Good  Plan      Co-evaluation                 AM-PAC OT "6 Clicks" Daily Activity     Outcome Measure   Help from another person eating meals?: A Little Help from another person taking care of personal grooming?: A Little Help from another person toileting, which includes using toliet, bedpan, or urinal?: Total Help from another person bathing (including washing, rinsing, drying)?: A Lot Help from another person to put on and taking off regular upper body clothing?: A Lot Help from another person to put on and taking off regular lower body clothing?: Total 6 Click Score: 12    End of Session Equipment Utilized During Treatment: Gait belt;Other (comment) (sling)  OT Visit Diagnosis: Unsteadiness on feet (R26.81);Other abnormalities of gait and mobility (R26.89);Muscle weakness (generalized) (M62.81);History of falling (Z91.81)   Activity Tolerance Patient tolerated treatment well   Patient Left in bed;with call bell/phone within reach;with bed alarm set   Nurse Communication Mobility status        Time: 1610-9604 OT Time Calculation (min): 28 min  Charges: OT General Charges $OT Visit: 1 Visit OT Treatments $Self Care/Home Management : 23-37 mins  Berna Spare, OTR/L Acute Rehabilitation Services Office: [phone]   Evern Bio 06/22/2023, 2:29 PM

## 2023-06-22 NOTE — Progress Notes (Addendum)
[facility]  PROGRESS NOTE  Lorraine James FAO:[id number] DOB: [birth year] DOA: 06/16/2023 PCP: Paulina Fusi, MD   Brief HPI:   77 year old woman who presented as a level 1 trauma after falling at home on blood thinners. Full trauma workup revealed a left humeral fracture.  Patient has had persistent encephalopathy since admission.  UA was positive for infection.  Due to concern for airway protection, PCCM was consulted. Did not require intubation She was transferred to medical floor, [facility] assumed care on 11/20 She was discharged a week ago from [facility] due to confusion, they could not find a reason except for dehydration. Patient is on gabapentin, oxycodone, baclofen at home    Assessment/Plan:   Acute metabolic encephalopathy -Insetting of UTI, multiple psychotropic pain medications -Resolved at this time -Avoid sedating medications -Ammonia level 13 -EEG showed no epileptiform discharges; showed continuous slow generalized recording, suggestive of moderate diffuse encephalopathy  Chronic pain syndrome -Restarted baclofen 5 mg p.o. 3 times daily as needed -Oxycodone at a reduced dose of 5 mg every 6 hours as needed -Hold gabapentin for now  Hypokalemia -Replete  Anemia -Hemoglobin is up to 8.0  Loose stools -Resolved after 1 dose of Imodium -Will continue to monitor - GI pathogen panel is negative  UTI -Started on Zosyn -Urine culture grew yeast -Will discontinue Zosyn -Started on Diflucan 200 mg p.o. daily for 5 days  Status post fall, left humerus fracture -Orthopedics consulted -Nonweightbearing in sling -Follow-up Dr. Roda Shutters in 2 to 3 weeks  Diabetes mellitus type 2 -CBG well-controlled -Continue sliding scale insulin with NovoLog  Hypertension -Continue metoprolol 50 mg p.o. 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 -Xarelto has been resumed  Medications     atorvastatin   40 mg Oral Daily   fluconazole  200 mg Oral Daily   insulin aspart  0-9 Units Subcutaneous Q4H   metoprolol tartrate  50 mg Oral BID   rivaroxaban  20 mg Oral QPC supper     Data Reviewed:   CBG:  Recent Labs  Lab 06/21/23 0824 06/21/23 1144 06/21/23 1657 06/21/23 2005 06/22/23 0347  GLUCAP 80 82 95 87 73    SpO2: 99 %    Vitals:   06/21/23 2007 06/21/23 2327 06/22/23 0350 06/22/23 0821  BP: (!) 162/77 (!) 160/83 (!) 170/80 (!) 168/86  Pulse: 95 90 90 93  Resp: (!) 22 (!) 21  17  Temp: 99.2 F (37.3 C)  98.2 F (36.8 C) 99.1 F (37.3 C)  TempSrc: Axillary  Axillary Oral  SpO2: 98% 96% 100% 99%  Weight:      Height:          Data Reviewed:  Basic Metabolic Panel: Recent Labs  Lab 06/16/23 2048 06/16/23 2248 06/16/23 2316 06/20/23 0605 06/20/23 0929 06/21/23 0755  NA 141 144 143 138  --  139  K 3.8 3.8 3.7 2.7*  --  4.0  CL 106 103  --  103  --  106  CO2 27  --   --  26  --  21*  GLUCOSE 136* 134*  --  110*  --  83  BUN 21 23  --  11  --  10  CREATININE 1.44* 1.30*  --  0.70  --  0.76  CALCIUM 8.4*  --   --  8.8*  --  9.2  MG  --   --   --   --  1.5*  --  CBC: Recent Labs  Lab 06/16/23 2048 06/16/23 2248 06/16/23 2316 06/20/23 0605 06/21/23 0755 06/22/23 0439  WBC 8.0  --   --  6.6 7.3 7.8  HGB 7.3* 7.8* 7.1* 8.0* 9.0* 8.5*  HCT 23.5* 23.0* 21.0* 24.8* 27.1* 26.0*  MCV 101.3*  --   --  95.8 95.1 94.2  PLT 189  --   --  214 257 279    LFT Recent Labs  Lab 06/16/23 2048  AST 39  ALT 19  ALKPHOS 33*  BILITOT 0.9  PROT 5.6*  ALBUMIN 2.9*     Antibiotics: Anti-infectives (From admission, onward)    Start     Dose/Rate Route Frequency Ordered Stop   06/21/23 1115  fluconazole (DIFLUCAN) tablet 200 mg  Status:  Discontinued        200 mg Oral Daily 06/21/23 1023 06/21/23 1027   06/21/23 1115  fluconazole (DIFLUCAN) tablet 200 mg        200 mg Oral Daily 06/21/23 1027 06/26/23 0959   06/21/23 1045  fluconazole (DIFLUCAN)  tablet 100 mg  Status:  Discontinued        100 mg Oral Daily 06/21/23 0952 06/21/23 1022   06/17/23 1000  piperacillin-tazobactam (ZOSYN) IVPB 3.375 g  Status:  Discontinued        3.375 g 12.5 mL/hr over 240 Minutes Intravenous Every 8 hours 06/17/23 0608 06/19/23 0846   06/17/23 0430  cefTRIAXone (ROCEPHIN) 1 g in sodium chloride 0.9 % 100 mL IVPB        1 g 200 mL/hr over 30 Minutes Intravenous  Once 06/17/23 0415 06/17/23 0617        DVT prophylaxis: SCDs  Code Status: DNR  Family Communication: No family at bedside   CONSULTS orthopedics   Subjective   Denies any more loose stools.  No abdominal pain.  GI pathogen panel negative.  Objective    Physical Examination:  General-appears in no acute distress Heart-S1-S2, regular, no murmur auscultated Lungs-clear to auscultation bilaterally, no wheezing or crackles auscultated Abdomen-soft, nontender, no organomegaly Extremities-no edema in the lower extremities Neuro-alert, oriented x3, no focal deficit noted  Status is: Inpatient:          Meredeth Ide   [facility] If 7PM-7AM, please contact night-coverage at [email or web address], Office  [phone]   06/22/2023, 9:31 AM  LOS: 5 days

## 2024-08-29 DEATH — deceased
